# Patient Record
Sex: Female | Born: 1965 | Race: White | Hispanic: No | Marital: Married | Smoking: Never smoker
Health system: Southern US, Community
[De-identification: ages and names within clinical notes are randomized; demographics above are authoritative.]

## PROBLEM LIST (undated history)

## (undated) DIAGNOSIS — F329 Major depressive disorder, single episode, unspecified: Secondary | ICD-10-CM

## (undated) DIAGNOSIS — K5900 Constipation, unspecified: Secondary | ICD-10-CM

## (undated) DIAGNOSIS — E049 Nontoxic goiter, unspecified: Secondary | ICD-10-CM

## (undated) DIAGNOSIS — N809 Endometriosis, unspecified: Secondary | ICD-10-CM

## (undated) DIAGNOSIS — T7840XA Allergy, unspecified, initial encounter: Secondary | ICD-10-CM

## (undated) DIAGNOSIS — F32A Depression, unspecified: Secondary | ICD-10-CM

## (undated) DIAGNOSIS — G4730 Sleep apnea, unspecified: Secondary | ICD-10-CM

## (undated) HISTORY — DX: Endometriosis, unspecified: N80.9

## (undated) HISTORY — PX: BIOPSY THYROID: PRO38

## (undated) HISTORY — PX: COLONOSCOPY: SHX174

## (undated) HISTORY — DX: Allergy, unspecified, initial encounter: T78.40XA

## (undated) HISTORY — DX: Constipation, unspecified: K59.00

## (undated) HISTORY — PX: BREAST BIOPSY: SHX20

## (undated) HISTORY — DX: Sleep apnea, unspecified: G47.30

## (undated) HISTORY — PX: LEFT OOPHORECTOMY: SHX1961

## (undated) HISTORY — DX: Major depressive disorder, single episode, unspecified: F32.9

## (undated) HISTORY — DX: Depression, unspecified: F32.A

## (undated) HISTORY — DX: Nontoxic goiter, unspecified: E04.9

## (undated) HISTORY — PX: LAPAROTOMY: SHX154

---

## 1997-02-27 HISTORY — PX: CHOLECYSTECTOMY: SHX55

## 2003-01-21 ENCOUNTER — Other Ambulatory Visit: Admission: RE | Admit: 2003-01-21 | Discharge: 2003-01-21 | Payer: Self-pay | Admitting: Obstetrics and Gynecology

## 2003-04-16 ENCOUNTER — Encounter: Admission: RE | Admit: 2003-04-16 | Discharge: 2003-04-16 | Payer: Self-pay | Admitting: Obstetrics and Gynecology

## 2003-07-02 ENCOUNTER — Encounter: Admission: RE | Admit: 2003-07-02 | Discharge: 2003-09-30 | Payer: Self-pay | Admitting: Internal Medicine

## 2003-10-12 ENCOUNTER — Encounter: Admission: RE | Admit: 2003-10-12 | Discharge: 2003-10-12 | Payer: Self-pay | Admitting: Internal Medicine

## 2004-02-23 ENCOUNTER — Ambulatory Visit: Payer: Self-pay | Admitting: Family Medicine

## 2004-04-01 ENCOUNTER — Other Ambulatory Visit: Admission: RE | Admit: 2004-04-01 | Discharge: 2004-04-01 | Payer: Self-pay | Admitting: Obstetrics and Gynecology

## 2004-04-26 ENCOUNTER — Ambulatory Visit: Payer: Self-pay | Admitting: Internal Medicine

## 2004-05-02 ENCOUNTER — Ambulatory Visit: Payer: Self-pay | Admitting: Internal Medicine

## 2004-09-01 ENCOUNTER — Ambulatory Visit: Payer: Self-pay | Admitting: Internal Medicine

## 2004-09-30 ENCOUNTER — Ambulatory Visit: Payer: Self-pay | Admitting: Internal Medicine

## 2005-06-09 ENCOUNTER — Other Ambulatory Visit: Admission: RE | Admit: 2005-06-09 | Discharge: 2005-06-09 | Payer: Self-pay | Admitting: Obstetrics and Gynecology

## 2005-07-04 ENCOUNTER — Ambulatory Visit: Payer: Self-pay | Admitting: Internal Medicine

## 2005-07-28 ENCOUNTER — Ambulatory Visit: Payer: Self-pay | Admitting: Internal Medicine

## 2005-08-25 ENCOUNTER — Ambulatory Visit: Payer: Self-pay | Admitting: Internal Medicine

## 2005-09-29 ENCOUNTER — Ambulatory Visit: Payer: Self-pay | Admitting: Internal Medicine

## 2005-10-26 ENCOUNTER — Ambulatory Visit: Payer: Self-pay | Admitting: Internal Medicine

## 2005-12-08 ENCOUNTER — Ambulatory Visit: Payer: Self-pay | Admitting: Internal Medicine

## 2006-02-05 ENCOUNTER — Ambulatory Visit: Payer: Self-pay | Admitting: Internal Medicine

## 2006-02-21 ENCOUNTER — Ambulatory Visit: Payer: Self-pay | Admitting: Internal Medicine

## 2006-02-21 LAB — CONVERTED CEMR LAB
ALT: 28 units/L (ref 0–40)
Alkaline Phosphatase: 74 units/L (ref 39–117)
BUN: 13 mg/dL (ref 6–23)
Basophils Relative: 0.4 % (ref 0.0–1.0)
Calcium: 8.5 mg/dL (ref 8.4–10.5)
Cholesterol: 193 mg/dL (ref 0–200)
GFR calc non Af Amer: 65 mL/min
Glomerular Filtration Rate, Af Am: 79 mL/min/{1.73_m2}
Glucose, Bld: 90 mg/dL (ref 70–99)
LDL Cholesterol: 116 mg/dL — ABNORMAL HIGH (ref 0–99)
Lymphocytes Relative: 36.2 % (ref 12.0–46.0)
Platelets: 300 10*3/uL (ref 150–400)
Potassium: 3.6 meq/L (ref 3.5–5.1)
TSH: 4.15 microintl units/mL (ref 0.35–5.50)
Total Protein: 6.2 g/dL (ref 6.0–8.3)
WBC: 9.2 10*3/uL (ref 4.5–10.5)

## 2006-03-23 ENCOUNTER — Ambulatory Visit: Payer: Self-pay | Admitting: Internal Medicine

## 2006-07-11 ENCOUNTER — Other Ambulatory Visit: Admission: RE | Admit: 2006-07-11 | Discharge: 2006-07-11 | Payer: Self-pay | Admitting: Obstetrics and Gynecology

## 2006-07-20 ENCOUNTER — Ambulatory Visit: Payer: Self-pay | Admitting: Internal Medicine

## 2006-08-23 ENCOUNTER — Encounter: Admission: RE | Admit: 2006-08-23 | Discharge: 2006-08-23 | Payer: Self-pay | Admitting: Obstetrics and Gynecology

## 2006-09-06 ENCOUNTER — Encounter: Payer: Self-pay | Admitting: Internal Medicine

## 2006-09-06 DIAGNOSIS — N809 Endometriosis, unspecified: Secondary | ICD-10-CM | POA: Insufficient documentation

## 2006-09-06 DIAGNOSIS — J309 Allergic rhinitis, unspecified: Secondary | ICD-10-CM | POA: Insufficient documentation

## 2006-09-06 DIAGNOSIS — F329 Major depressive disorder, single episode, unspecified: Secondary | ICD-10-CM | POA: Insufficient documentation

## 2006-09-06 DIAGNOSIS — F3289 Other specified depressive episodes: Secondary | ICD-10-CM | POA: Insufficient documentation

## 2006-11-16 ENCOUNTER — Ambulatory Visit: Payer: Self-pay | Admitting: Internal Medicine

## 2006-11-16 DIAGNOSIS — L538 Other specified erythematous conditions: Secondary | ICD-10-CM | POA: Insufficient documentation

## 2006-11-16 DIAGNOSIS — R946 Abnormal results of thyroid function studies: Secondary | ICD-10-CM | POA: Insufficient documentation

## 2006-11-23 LAB — CONVERTED CEMR LAB
CO2: 29 meq/L (ref 19–32)
Cholesterol: 195 mg/dL (ref 0–200)
Creatinine, Ser: 1 mg/dL (ref 0.4–1.2)
Glucose, Bld: 129 mg/dL — ABNORMAL HIGH (ref 70–99)
HDL: 51.6 mg/dL (ref >39.0)
Potassium: 4.6 meq/L (ref 3.5–5.1)
Sodium: 143 meq/L (ref 135–145)
Triglycerides: 173 mg/dL — ABNORMAL HIGH (ref 0–149)
VLDL: 35 mg/dL (ref 0–40)

## 2006-12-20 ENCOUNTER — Ambulatory Visit: Payer: Self-pay | Admitting: Internal Medicine

## 2006-12-20 DIAGNOSIS — R599 Enlarged lymph nodes, unspecified: Secondary | ICD-10-CM | POA: Insufficient documentation

## 2006-12-20 LAB — CONVERTED CEMR LAB
CMV IgM: <0.9 (ref <0.90)
Toxoplasma Antibody- IgM: 0.38

## 2007-01-04 ENCOUNTER — Ambulatory Visit: Payer: Self-pay | Admitting: Internal Medicine

## 2007-01-04 DIAGNOSIS — R5381 Other malaise: Secondary | ICD-10-CM | POA: Insufficient documentation

## 2007-01-04 DIAGNOSIS — R5383 Other fatigue: Secondary | ICD-10-CM

## 2007-01-08 ENCOUNTER — Telehealth: Payer: Self-pay | Admitting: Internal Medicine

## 2007-01-15 ENCOUNTER — Ambulatory Visit: Payer: Self-pay | Admitting: Pulmonary Disease

## 2007-01-30 ENCOUNTER — Encounter: Payer: Self-pay | Admitting: Pulmonary Disease

## 2007-01-30 ENCOUNTER — Ambulatory Visit (HOSPITAL_BASED_OUTPATIENT_CLINIC_OR_DEPARTMENT_OTHER): Admission: RE | Admit: 2007-01-30 | Discharge: 2007-01-30 | Payer: Self-pay | Admitting: Pulmonary Disease

## 2007-02-14 ENCOUNTER — Ambulatory Visit: Payer: Self-pay | Admitting: Pulmonary Disease

## 2007-02-14 ENCOUNTER — Telehealth (INDEPENDENT_AMBULATORY_CARE_PROVIDER_SITE_OTHER): Payer: Self-pay | Admitting: *Deleted

## 2007-02-15 ENCOUNTER — Telehealth (INDEPENDENT_AMBULATORY_CARE_PROVIDER_SITE_OTHER): Payer: Self-pay | Admitting: *Deleted

## 2007-02-26 ENCOUNTER — Ambulatory Visit: Payer: Self-pay | Admitting: Pulmonary Disease

## 2007-02-26 DIAGNOSIS — G4733 Obstructive sleep apnea (adult) (pediatric): Secondary | ICD-10-CM | POA: Insufficient documentation

## 2007-03-13 ENCOUNTER — Telehealth: Payer: Self-pay | Admitting: Internal Medicine

## 2007-03-13 DIAGNOSIS — M545 Low back pain, unspecified: Secondary | ICD-10-CM | POA: Insufficient documentation

## 2007-03-29 ENCOUNTER — Ambulatory Visit: Payer: Self-pay | Admitting: Pulmonary Disease

## 2007-04-19 ENCOUNTER — Ambulatory Visit: Payer: Self-pay | Admitting: Internal Medicine

## 2007-04-19 DIAGNOSIS — E669 Obesity, unspecified: Secondary | ICD-10-CM | POA: Insufficient documentation

## 2007-04-23 ENCOUNTER — Telehealth (INDEPENDENT_AMBULATORY_CARE_PROVIDER_SITE_OTHER): Payer: Self-pay | Admitting: *Deleted

## 2007-05-10 ENCOUNTER — Encounter: Payer: Self-pay | Admitting: Internal Medicine

## 2007-05-13 ENCOUNTER — Encounter: Payer: Self-pay | Admitting: Pulmonary Disease

## 2007-05-17 ENCOUNTER — Encounter: Admission: RE | Admit: 2007-05-17 | Discharge: 2007-05-17 | Payer: Self-pay | Admitting: Internal Medicine

## 2007-09-16 ENCOUNTER — Encounter: Admission: RE | Admit: 2007-09-16 | Discharge: 2007-09-16 | Payer: Self-pay | Admitting: Obstetrics and Gynecology

## 2007-09-20 ENCOUNTER — Other Ambulatory Visit: Admission: RE | Admit: 2007-09-20 | Discharge: 2007-09-20 | Payer: Self-pay | Admitting: Obstetrics and Gynecology

## 2007-10-28 ENCOUNTER — Telehealth: Payer: Self-pay | Admitting: Internal Medicine

## 2007-11-07 ENCOUNTER — Encounter: Admission: RE | Admit: 2007-11-07 | Discharge: 2007-11-07 | Payer: Self-pay | Admitting: Internal Medicine

## 2007-11-15 ENCOUNTER — Encounter: Payer: Self-pay | Admitting: Internal Medicine

## 2007-11-18 ENCOUNTER — Encounter: Payer: Self-pay | Admitting: Internal Medicine

## 2007-12-20 ENCOUNTER — Telehealth: Payer: Self-pay | Admitting: Internal Medicine

## 2007-12-24 ENCOUNTER — Encounter: Payer: Self-pay | Admitting: Internal Medicine

## 2008-01-07 ENCOUNTER — Telehealth: Payer: Self-pay | Admitting: Internal Medicine

## 2008-01-24 ENCOUNTER — Ambulatory Visit: Payer: Self-pay | Admitting: Internal Medicine

## 2008-01-24 DIAGNOSIS — M25519 Pain in unspecified shoulder: Secondary | ICD-10-CM | POA: Insufficient documentation

## 2008-01-28 ENCOUNTER — Encounter: Payer: Self-pay | Admitting: Internal Medicine

## 2008-01-30 ENCOUNTER — Telehealth: Payer: Self-pay | Admitting: Internal Medicine

## 2008-02-17 ENCOUNTER — Encounter: Payer: Self-pay | Admitting: Internal Medicine

## 2008-07-24 ENCOUNTER — Ambulatory Visit: Payer: Self-pay | Admitting: Internal Medicine

## 2008-07-24 ENCOUNTER — Encounter: Admission: RE | Admit: 2008-07-24 | Discharge: 2008-07-24 | Payer: Self-pay | Admitting: Endocrinology

## 2008-07-24 DIAGNOSIS — K6289 Other specified diseases of anus and rectum: Secondary | ICD-10-CM | POA: Insufficient documentation

## 2008-07-24 DIAGNOSIS — L290 Pruritus ani: Secondary | ICD-10-CM | POA: Insufficient documentation

## 2008-08-11 ENCOUNTER — Encounter: Payer: Self-pay | Admitting: Internal Medicine

## 2008-08-11 ENCOUNTER — Other Ambulatory Visit: Admission: RE | Admit: 2008-08-11 | Discharge: 2008-08-11 | Payer: Self-pay | Admitting: Diagnostic Radiology

## 2008-08-11 ENCOUNTER — Encounter: Admission: RE | Admit: 2008-08-11 | Discharge: 2008-08-11 | Payer: Self-pay | Admitting: Endocrinology

## 2008-08-11 ENCOUNTER — Encounter (INDEPENDENT_AMBULATORY_CARE_PROVIDER_SITE_OTHER): Payer: Self-pay | Admitting: Diagnostic Radiology

## 2008-09-02 ENCOUNTER — Telehealth (INDEPENDENT_AMBULATORY_CARE_PROVIDER_SITE_OTHER): Payer: Self-pay | Admitting: *Deleted

## 2008-10-09 ENCOUNTER — Other Ambulatory Visit: Admission: RE | Admit: 2008-10-09 | Discharge: 2008-10-09 | Payer: Self-pay | Admitting: Obstetrics and Gynecology

## 2008-12-31 ENCOUNTER — Ambulatory Visit: Payer: Self-pay | Admitting: Internal Medicine

## 2008-12-31 DIAGNOSIS — R197 Diarrhea, unspecified: Secondary | ICD-10-CM | POA: Insufficient documentation

## 2009-02-02 ENCOUNTER — Encounter: Payer: Self-pay | Admitting: Internal Medicine

## 2009-02-09 ENCOUNTER — Telehealth: Payer: Self-pay | Admitting: Internal Medicine

## 2009-02-15 ENCOUNTER — Encounter: Admission: RE | Admit: 2009-02-15 | Discharge: 2009-02-15 | Payer: Self-pay | Admitting: Endocrinology

## 2009-02-17 ENCOUNTER — Ambulatory Visit: Payer: Self-pay | Admitting: Internal Medicine

## 2009-02-17 LAB — CONVERTED CEMR LAB
BUN: 14 mg/dL (ref 6–23)
CO2: 26 meq/L (ref 19–32)
Calcium: 8.6 mg/dL (ref 8.4–10.5)
Cholesterol: 203 mg/dL — ABNORMAL HIGH (ref 0–200)
GFR calc non Af Amer: 64.3 mL/min (ref >60)
Glucose, Bld: 98 mg/dL (ref 70–99)
HDL: 57 mg/dL (ref >39.00)
Triglycerides: 105 mg/dL (ref 0.0–149.0)

## 2009-03-01 ENCOUNTER — Encounter: Admission: RE | Admit: 2009-03-01 | Discharge: 2009-03-01 | Payer: Self-pay | Admitting: Obstetrics and Gynecology

## 2009-03-04 ENCOUNTER — Telehealth: Payer: Self-pay | Admitting: *Deleted

## 2009-06-25 ENCOUNTER — Encounter: Payer: Self-pay | Admitting: Internal Medicine

## 2009-07-15 ENCOUNTER — Telehealth: Payer: Self-pay | Admitting: Internal Medicine

## 2009-07-16 ENCOUNTER — Ambulatory Visit: Payer: Self-pay | Admitting: Internal Medicine

## 2009-07-16 LAB — CONVERTED CEMR LAB: Vit D, 25-Hydroxy: 45 ng/mL (ref 30–89)

## 2009-07-28 ENCOUNTER — Ambulatory Visit: Payer: Self-pay | Admitting: Internal Medicine

## 2009-07-28 DIAGNOSIS — D509 Iron deficiency anemia, unspecified: Secondary | ICD-10-CM | POA: Insufficient documentation

## 2009-07-28 DIAGNOSIS — G479 Sleep disorder, unspecified: Secondary | ICD-10-CM | POA: Insufficient documentation

## 2009-10-15 ENCOUNTER — Ambulatory Visit: Payer: Self-pay | Admitting: Internal Medicine

## 2009-10-15 LAB — CONVERTED CEMR LAB
Basophils Absolute: 0.1 10*3/uL (ref 0.0–0.1)
Eosinophils Relative: 1.4 % (ref 0.0–5.0)
HCT: 37.1 % (ref 36.0–46.0)
Hemoglobin: 12.7 g/dL (ref 12.0–15.0)
Lymphocytes Relative: 37.4 % (ref 12.0–46.0)
Lymphs Abs: 3.1 10*3/uL (ref 0.7–4.0)
Monocytes Relative: 4.6 % (ref 3.0–12.0)
Platelets: 291 10*3/uL (ref 150.0–400.0)
Saturation Ratios: 17.3 % — ABNORMAL LOW (ref 20.0–50.0)
Transferrin: 280.7 mg/dL (ref 212.0–360.0)
WBC: 8.4 10*3/uL (ref 4.5–10.5)

## 2009-10-22 ENCOUNTER — Ambulatory Visit: Payer: Self-pay | Admitting: Internal Medicine

## 2009-10-22 DIAGNOSIS — G5600 Carpal tunnel syndrome, unspecified upper limb: Secondary | ICD-10-CM | POA: Insufficient documentation

## 2009-12-17 ENCOUNTER — Encounter: Payer: Self-pay | Admitting: Internal Medicine

## 2010-01-24 ENCOUNTER — Ambulatory Visit: Payer: Self-pay | Admitting: Internal Medicine

## 2010-01-24 LAB — CONVERTED CEMR LAB
Ferritin: 40.7 ng/mL (ref 10.0–291.0)
Iron: 74 ug/dL (ref 42–145)
Saturation Ratios: 15.2 % — ABNORMAL LOW (ref 20.0–50.0)

## 2010-01-28 ENCOUNTER — Ambulatory Visit: Payer: Self-pay | Admitting: Internal Medicine

## 2010-01-28 DIAGNOSIS — L988 Other specified disorders of the skin and subcutaneous tissue: Secondary | ICD-10-CM | POA: Insufficient documentation

## 2010-03-20 ENCOUNTER — Encounter: Payer: Self-pay | Admitting: Orthopedic Surgery

## 2010-03-20 ENCOUNTER — Encounter: Payer: Self-pay | Admitting: Endocrinology

## 2010-03-29 NOTE — Assessment & Plan Note (Signed)
Summary: fup labs//ccm   Vital Signs:  Patient profile:   45 year old female Menstrual status:  regular LMP:     07/26/2009 Height:      68 inches Weight:      286 pounds BMI:     43.64 Pulse rate:   66 / minute BP sitting:   110 / 70  (left arm) Cuff size:   large  Vitals Entered By: Romualdo Bolk, CMA (AAMA) (July 28, 2009 3:15 PM) CC: Follow-up visit on labs LMP (date): 07/26/2009 LMP - Character: normal Menarche (age onset years): unsure   Menses interval (days): 28 Menstrual flow (days): 4-5 Enter LMP: 07/26/2009   History of Present Illness: Maria Terrell comes in today   follow up labs   On loovral  and  pristique  doing ok.  NO heavy bleeding.  Also taking Vitamins  otc  ocall zyrtec.   Sees Dr Nolen Mu    and on meds to try for alertness .had annothe sleep test.     and told she has mild sleep apnea.   and unusual leg movements    at times.  Dr Nolen Mu ordered some labs that showed low iron .  has been sleeping in recliiner   for the past year  because of back pain.    pinched nerve in back.    Does have problem getting comfortable but no shaking of legs ...  Preventive Screening-Counseling & Management  Alcohol-Tobacco     Alcohol drinks/day: 0     Smoking Status: never  Caffeine-Diet-Exercise     Caffeine use/day: 4     Does Patient Exercise: no  Current Medications (verified): 1)  Zyrtec Allergy 10 Mg Tabs (Cetirizine Hcl) .... Once Daily As Needed 2)  Valtrex 500 Mg  Tabs (Valacyclovir Hcl) .... As Needed 3)  Multivitamins   Tabs (Multiple Vitamin) .... Once Daily 4)  Bl Echinacea 167 Mg  Tabs (Echinacea) .... Three Tabs Three Times A Day As Needed 5)  Flexeril 10 Mg  Tabs (Cyclobenzaprine Hcl) .Marland Kitchen.. 1 By Mouth Three Times A Day As Needed 6)  Meloxicam 15 Mg Tabs (Meloxicam) .... Take One Tab Once Daily 7)  Pristiq 50 Mg Xr24h-Tab (Desvenlafaxine Succinate) .Marland Kitchen.. 1 By Mouth Once Daily 8)  Anusol-Hc 25 Mg Supp (Hydrocortisone Acetate) .... One Pr Two  Times A Day For Up To 2 Weeks 9)  Vitamin D (Ergocalciferol) 50000 Unit Caps (Ergocalciferol) .Marland Kitchen.. 1 By Mouth Weekly  Allergies (verified): No Known Drug Allergies  Past History:  Past medical, surgical, family and social histories (including risk factors) reviewed, and no changes noted (except as noted below).  Past Medical History: Allergic rhinitis Depression Endometriosis goiter  thyroid nodule and needle bx  Low back pain Mild sleep apnea    Past Surgical History: Reviewed history from 09/06/2006 and no changes required. Laparotomy-exploratory for left partial oophorectomy and endometrium  Past History:  Care Management: Endocrinology: Horald Pollen Orthopedics: Dewaine Conger Neurology: Christena Flake Gynecology: Tinnie Gens Psychiatry: Nolen Mu  Family History: Reviewed history from 04/19/2007 and no changes required. Family History of CAD Female 1st degree relative..  father has stent Family History High cholesterol Family History Thyroid disease Father OSA Bro BIPOLAR  Social History: Reviewed history from 07/24/2008 and no changes required. 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  Review of Systems  The patient denies anorexia, fever, weight loss, vision loss, decreased hearing, prolonged cough, melena, hematochezia, severe indigestion/heartburn, hematuria, transient blindness, abnormal bleeding, enlarged  lymph nodes, angioedema, and breast masses.    Physical Exam  General:  alert, well-developed, well-nourished, and well-hydrated.   Head:  normocephalic and atraumatic.   Neck:  palpable thyroid no masses otherwise supple.   Msk:  no atrophy nl gait  tender lower ls area   Pulses:  pulses intact without delay  nl cap refill  Extremities:  no clubbing cyanosis or edema  Neurologic:  alert & oriented X3, strength normal in all extremities, and gait normal.   Skin:  turgor normal, color normal, no ecchymoses, and no petechiae.     Cervical Nodes:  No lymphadenopathy noted Psych:  Oriented X3, normally interactive, good eye contact, and not anxious appearing.  slightly subdued cognition nl  labs  obtained by calling Dr Walker Shadow office and  reviewed while patient was here. 25 minutes   Impression & Recommendations:  Problem # 1:  IRON DEFICIENCY (ICD-280.9) low iron saturation   no excess bleeding    could contribute to nocturnal leg movements  so will rx and then follow up counseled  Her updated medication list for this problem includes:    Ferrex 150 150 Mg Caps (Polysaccharide iron complex) .Marland Kitchen... 1 by mouth once daily  ferritin 37   iron saturation  11.2 % ( 20-55)  Problem # 2:  SLEEP DISORDER (ICD-780.50) combo sleep apnea , pain and other  Problem # 3:  LOW BACK PAIN, CHRONIC (ICD-724.2) Assessment: Unchanged  Her updated medication list for this problem includes:    Flexeril 10 Mg Tabs (Cyclobenzaprine hcl) .Marland Kitchen... 1 by mouth three times a day as needed    Meloxicam 15 Mg Tabs (Meloxicam) .Marland Kitchen... Take one tab once daily  Problem # 4:  OBSTRUCTIVE SLEEP APNEA (ICD-327.23) Assessment: Comment Only mild   Problem # 5:  DEPRESSION (ICD-311)  Her updated medication list for this problem includes:    Pristiq 50 Mg Xr24h-tab (Desvenlafaxine succinate) .Marland Kitchen... 1 by mouth once daily  Problem # 6:  OBESITY (ICD-278.00) Assessment: Comment Only  Complete Medication List: 1)  Zyrtec Allergy 10 Mg Tabs (Cetirizine hcl) .... Once daily as needed 2)  Valtrex 500 Mg Tabs (Valacyclovir hcl) .... As needed 3)  Multivitamins Tabs (Multiple vitamin) .... Once daily 4)  Bl Echinacea 167 Mg Tabs (Echinacea) .... Three tabs three times a day as needed 5)  Flexeril 10 Mg Tabs (Cyclobenzaprine hcl) .Marland Kitchen.. 1 by mouth three times a day as needed 6)  Meloxicam 15 Mg Tabs (Meloxicam) .... Take one tab once daily 7)  Pristiq 50 Mg Xr24h-tab (Desvenlafaxine succinate) .Marland Kitchen.. 1 by mouth once daily 8)  Anusol-hc 25 Mg Supp  (Hydrocortisone acetate) .... One pr two times a day for up to 2 weeks 9)  Vitamin D (ergocalciferol) 50000 Unit Caps (Ergocalciferol) .Marland Kitchen.. 1 by mouth weekly 10)  Nuvigil 250 Mg Tabs (Armodafinil) .... 1/2 by mouth as needed  per dr Nolen Mu 11)  Ferrex 150 150 Mg Caps (Polysaccharide iron complex) .Marland Kitchen.. 1 by mouth once daily  Patient Instructions: 1)  take iron  1 each day  with 250 mg vitamin c to help absorption . 2)  in 2-3 months  check IBC panel and ferritin and cbcdiff ( dx iron  saturation) 3)  then OV . 4)  this may helps your legs at night  if iron  stores improve.  5)  Call in the meantime  if ther is a problem with this. Prescriptions: FERREX 150 150 MG CAPS (POLYSACCHARIDE IRON COMPLEX) 1 by mouth once daily  #  30 x 3   Entered and Authorized by:   Madelin Headings MD   Signed by:   Madelin Headings MD on 07/28/2009   Method used:   Print then Give to Patient   RxID:   [id number]

## 2010-03-29 NOTE — Progress Notes (Signed)
Summary: Rx for 50,000 IU   Phone Note Call from Patient Call back at Home Phone [phone]   Caller: Patient Summary of Call: Pt called saying that she spoke with Durwin Nora her nutrionisist and she told her that she needs a rx for 50,000 International Units of vit d called in. [place]ace [place] told her to take this for 12 weeks then to have her levels checked again and if normal she could go to 2,000 international units of vit d once a day. But that we would need to call this in. Pt is wanting to know if we can call in rx. Initial call taken by: Romualdo Bolk, CMA (AAMA),  March 04, 2009 11:07 AM  Follow-up for Phone Call        no standard for this treatment but  can do the weekly 50000 international units one  q week for  12 weeks   disp 12  refill x 1 can recheck level and .ROV     here to review Follow-up by: Madelin Headings MD,  March 04, 2009 11:45 AM  Additional Follow-up for Phone Call Additional follow up Details #1::        Left message on machine that rx was sent to [facility]. Also to schedule a vit d level and rov. Additional Follow-up by: Romualdo Bolk, CMA (AAMA),  March 04, 2009 11:50 AM    New/Updated Medications: VITAMIN D (ERGOCALCIFEROL) 50000 UNIT CAPS (ERGOCALCIFEROL) 1 by mouth weekly Prescriptions: VITAMIN D (ERGOCALCIFEROL) 50000 UNIT CAPS (ERGOCALCIFEROL) 1 by mouth weekly  #12 x 1   Entered by:   Romualdo Bolk, CMA (AAMA)   Authorized by:   Madelin Headings MD   Signed by:   Romualdo Bolk, CMA (AAMA) on 03/04/2009   Method used:   [facility] to        [facility]  [phone]* (retail)       [address] [place], [place]  [address]       Ph: [phone] or [phone]       Fax: [phone]   RxID:   [id number]

## 2010-03-29 NOTE — Progress Notes (Signed)
Summary: add B12 per Nutrionist?  Phone Note Call from Patient   Caller: Patient Call For: Madelin Headings MD Summary of Call: Pt wants to schedule her Vitamin D lab appt, and add B12 to the order? [phone] May leave message. Initial call taken by: Lynann Beaver CMA,  Jul 15, 2009 9:49 AM  Follow-up for Phone Call        pt called again. is it ok to add b12 to her lab appt. her vit d lab is scheduled for tomorrow pm. Follow-up by: Warnell Forester,  Jul 15, 2009 10:06 AM  Additional Follow-up for Phone Call Additional follow up Details #1::        Per Dr. Fabian Sharp- okay to do.  Additional Follow-up by: Romualdo Bolk, CMA Duncan Dull),  Jul 15, 2009 11:34 AM    Additional Follow-up for Phone Call Additional follow up Details #2::    pt is aware. Follow-up by: Warnell Forester,  Jul 15, 2009 12:19 PM

## 2010-03-29 NOTE — Letter (Signed)
Summary: [facility]   Imported By: Maryln Gottron 12/28/2009 09:57:21  _____________________________________________________________________  External Attachment:    Type:   Image     Comment:   External Document

## 2010-03-29 NOTE — Assessment & Plan Note (Signed)
Summary: 3 month rov/njr [facility] BMP/NJR   Vital Signs:  Patient profile:   45 year old female Menstrual status:  regular Weight:      295 pounds Temp:     99.6 degrees F oral BP sitting:   122 / 82  (left arm) Cuff size:   regular  Vitals Entered By: Kern Reap CMA Duncan Dull) (January 28, 2010 2:23 PM) CC: follow-up visit   History of Present Illness: Maria Terrell comes in today  for follow up of iron deficiency  and fatigue  . also has a sore bump on right  buttocks are  just popped up .  no fever or trauma.   OSA  just started using  rx regularly recently nasal pillows  but needs a mask.   not seeing Dr Shelle Iron .  ? Dr Richardean Chimera and or Dr Nolen Mu .      feels better energy.  Unsure who is managing th esleep apnea.    Preventive Screening-Counseling & Management  Alcohol-Tobacco     Alcohol drinks/day: 0     Smoking Status: never  Allergies: No Known Drug Allergies  Past History:  Past medical, surgical, family and social histories (including risk factors) reviewed for relevance to current acute and chronic problems.  Past Medical History: Reviewed history from 07/28/2009 and no changes required. Allergic rhinitis Depression Endometriosis goiter  thyroid nodule and needle bx  Low back pain Mild sleep apnea    Past Surgical History: Reviewed history from 09/06/2006 and no changes required. Laparotomy-exploratory for left partial oophorectomy and endometrium  Past History:  Care Management: Endocrinology: Horald Pollen Orthopedics: Delbert Harness  Neurology: Claudina Lick ,Dohmeir? Gynecology: Tinnie Gens Psychiatry: Nolen Mu  Dr Shelle Iron in the past  Family History: Reviewed history from 04/19/2007 and no changes required. Family History of CAD Female 1st degree relative..  father has stent Family History High cholesterol Family History Thyroid disease Father OSA Bro BIPOLAR  Social History: Reviewed history from 10/22/2009 and no changes  required. Occupation:ultrasonographer right handed   Never Smoked  hh of 1 cutting back on caffeine.  Review of Systems       no change  fever  weigh t loss  bleeding    Physical Exam  General:  Well-developed,well-nourished,in no acute distress; alert,appropriate and cooperative throughout examination Lungs:  normal respiratory effort and no intercostal retractions.   Genitalia:  normal introitus.  small 3 mm purplish bum with center slightly tender  right perineum    no drainage and no abscess or fluctuance  Neurologic:  grossly normal Skin:  no ecchymoses and no petechiae.  see gu exam Cervical Nodes:  No lymphadenopathy noted Inguinal Nodes:  No significant adenopathy Psych:  Oriented X3, normally interactive, good eye contact, not anxious appearing, and not depressed appearing.     Impression & Recommendations:  Problem # 1:  IRON DEFICIENCY (ICD-280.9) Assessment Unchanged about the same but   did nt take two times a day for the last month and got off schedule  Her updated medication list for this problem includes:    Ferrex 150 150 Mg Caps (Polysaccharide iron complex) .Marland Kitchen... 1 by mouth two times a day  Problem # 2:  OBSTRUCTIVE SLEEP APNEA (ICD-327.23) ? followed    sent via neur and psych   needs ? to try mask    rec she get whoever managing her OSa to do equipment refill    to ensure appropariate .lfu.   but call if needed    if  we need to  help.   Problem # 3:  OTHER SPECIFIED DISORDER OF SKIN (ICD-709.8) Assessment: New warm  compresses to   perineal area.    antibiotic if needed topical  to area  if persistent or  progressive  .  Complete Medication List: 1)  Zyrtec Allergy 10 Mg Tabs (Cetirizine hcl) .... Once daily as needed 2)  Valtrex 500 Mg Tabs (Valacyclovir hcl) .... As needed 3)  Multivitamins Tabs (Multiple vitamin) .... Once daily 4)  Bl Echinacea 167 Mg Tabs (Echinacea) .... Three tabs three times a day as needed 5)  Flexeril 10 Mg Tabs  (Cyclobenzaprine hcl) .Marland Kitchen.. 1 by mouth three times a day as needed 6)  Meloxicam 15 Mg Tabs (Meloxicam) .... Take one tab once daily 7)  Pristiq 50 Mg Xr24h-tab (Desvenlafaxine succinate) .Marland Kitchen.. 1 by mouth once daily 8)  Vitamin D 1000 Unit Tabs (Cholecalciferol) 9)  Nuvigil 250 Mg Tabs (Armodafinil) .... 1/2 by mouth as needed  per dr Nolen Mu 10)  Ferrex 150 150 Mg Caps (Polysaccharide iron complex) .Marland Kitchen.. 1 by mouth two times a day  Patient Instructions: 1)  take iron two times a day  2)  after 2 months call for  repeat labs.   3)  ov if needed.   or as approrpiates.    Orders Added: 1)  Est. Patient Level III [55732]

## 2010-03-29 NOTE — Op Note (Signed)
Summary: Ultrasound Guided Needle Aspirate Biopsy Lt. Lobe Thyroid  Ultrasound Guided Needle Aspirate Biopsy Lt. Lobe Thyroid   Imported By: Maryln Gottron 03/02/2009 14:59:49  _____________________________________________________________________  External Attachment:    Type:   Image     Comment:   External Document

## 2010-03-29 NOTE — Assessment & Plan Note (Signed)
Summary: ROA/FUP/RCD   Vital Signs:  Patient profile:   45 year old female Menstrual status:  regular LMP:     10/07/2009 Weight:      288 pounds Pulse rate:   72 / minute BP sitting:   120 / 80  (left arm) Cuff size:   large  Vitals Entered By: Romualdo Bolk, CMA (AAMA) (October 22, 2009 2:25 PM) CC: Follow-up visit on labs LMP (date): 10/07/2009 LMP - Character: normal Menarche (age onset years): unsure   Menses interval (days): 28 Menstrual flow (days): 4-5 Enter LMP: 10/07/2009   History of Present Illness: Maria Terrell comes in today  for follow up of labs sleep issues and iron defciency. 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   no numbness  using splint to help hand wrist thumb.     had ncs in past and showed mild cts .   IRON defic: ne extra bleeding or donations  nl menses. taking iron once a day  no se currently. taking with vitc. No major change in sleep at this point.   Mood: stable on meds.  Preventive Screening-Counseling & Management  Alcohol-Tobacco     Alcohol drinks/day: 0     Smoking Status: never  Caffeine-Diet-Exercise     Caffeine use/day: 4     Does Patient Exercise: no  Current Medications (verified): 1)  Zyrtec Allergy 10 Mg Tabs (Cetirizine Hcl) .... Once Daily As Needed 2)  Valtrex 500 Mg  Tabs (Valacyclovir Hcl) .... As Needed 3)  Multivitamins   Tabs (Multiple Vitamin) .... Once Daily 4)  Bl Echinacea 167 Mg  Tabs (Echinacea) .... Three Tabs Three Times A Day As Needed 5)  Flexeril 10 Mg  Tabs (Cyclobenzaprine Hcl) .Marland Kitchen.. 1 By Mouth Three Times A Day As Needed 6)  Meloxicam 15 Mg Tabs (Meloxicam) .... Take One Tab Once Daily 7)  Pristiq 50 Mg Xr24h-Tab (Desvenlafaxine Succinate) .Marland Kitchen.. 1 By Mouth Once Daily 8)  Vitamin D 1000 Unit Tabs (Cholecalciferol) 9)  Nuvigil 250 Mg  Tabs (Armodafinil) .... 1/2 By Mouth As Needed  Per Dr Nolen Mu 10)  Ferrex 150 150 Mg Caps (Polysaccharide Iron Complex) .Marland Kitchen.. 1 By Mouth Once Daily  Allergies (verified): No Known Drug Allergies  Past History:  Past medical, surgical, family and social histories (including risk factors) reviewed, and no changes noted (except as noted below).  Past Medical History: Reviewed history from 07/28/2009 and no changes required. Allergic rhinitis Depression Endometriosis goiter  thyroid nodule and needle bx  Low back pain Mild sleep apnea    Past Surgical History: Reviewed history from 09/06/2006 and no changes required. Laparotomy-exploratory for left partial oophorectomy and endometrium  Past History:  Care Management: Endocrinology: Horald Pollen Orthopedics: Delbert Harness  Neurology: Christena Flake  elsner  Gynecology: Tinnie Gens Psychiatry: Nolen Mu  Family History: Reviewed history from 04/19/2007 and no changes required. Family History of CAD Female 1st degree relative..  father has stent Family History High cholesterol Family History Thyroid disease Father OSA Bro BIPOLAR  Social History: Reviewed history from 07/24/2008 and no changes required. Occupation:ultrasonographer right handed   Never Smoked  hh of 1 cutting back on caffeine.  Review of Systems  The patient denies anorexia, fever, weight loss, weight gain, vision loss, syncope, dyspnea on exertion, prolonged cough, abdominal pain, melena, hematochezia, hematuria, muscle  weakness, abnormal bleeding, enlarged lymph nodes, and angioedema.         has low back pain issue followed by dr elsner . stable  sees dr Nolen Mu    Physical Exam  General:  Well-developed,well-nourished,in no acute distress; alert,appropriate and cooperative throughout examination Head:  normocephalic and atraumatic.   Eyes:  vision grossly intact, pupils equal, and pupils round.   Neck:  No deformities, masses, or tenderness noted. Lungs:   normal respiratory effort and no intercostal retractions.   Msk:  right arm no atrophy  no edema  tenderness  lateral elbow and above olecrenon and  near thumb extensor tendons.    grip ok    pain with rotation  Pulses:  pulses intact without delay  up nl cap refill  Skin:  turgor normal, color normal, no ecchymoses, and no petechiae.   Psych:  Oriented X3, good eye contact, and not anxious appearing.  mildly subdued but appropriate    Impression & Recommendations:  Problem # 1:  IRON DEFICIENCY (ICD-280.9) improved  but not   normal yet and no excessive bleeding   poss can tolerate  increase in dose  for awhile to see if helps sleep. as before  Her updated medication list for this problem includes:    Ferrex 150 150 Mg Caps (Polysaccharide iron complex) .Marland Kitchen... 1 by mouth two times a day  Problem # 2:  SLEEP DISORDER (ICD-780.50) see above.    Problem # 3:  CARPAL TUNNEL SYNDROME, RIGHT (ICD-354.0) hx of mildly abnormal  ncs but not in ehr.       no weakness  today   Problem # 4:  ? 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      ice hs and  strapping  some   Complete Medication List: 1)  Zyrtec Allergy 10 Mg Tabs (Cetirizine hcl) .... Once daily as needed 2)  Valtrex 500 Mg Tabs (Valacyclovir hcl) .... As needed 3)  Multivitamins Tabs (Multiple vitamin) .... Once daily 4)  Bl Echinacea 167 Mg Tabs (Echinacea) .... Three tabs three times a day as needed 5)  Flexeril 10 Mg Tabs (Cyclobenzaprine hcl) .Marland Kitchen.. 1 by mouth three times a day as needed 6)  Meloxicam 15 Mg Tabs (Meloxicam) .... Take one tab once daily 7)  Pristiq 50 Mg Xr24h-tab (Desvenlafaxine succinate) .Marland Kitchen.. 1 by mouth once daily 8)  Vitamin D 1000 Unit Tabs (Cholecalciferol) 9)  Nuvigil 250 Mg Tabs (Armodafinil) .... 1/2 by mouth as needed  per dr Nolen Mu 10)  Ferrex 150 150 Mg Caps (Polysaccharide iron complex) .Marland Kitchen.. 1 by  mouth two times a day  Patient Instructions: 1)  increase  iron to two times a day as tolerated  as we discussed  2)  repeat IBC  pain and ferritin  in  3 months .  and then rov.   3)  consider seeing der gramig.     [facility]   4)  consider icing nightly   .   strap on arm as discussed ,   Prescriptions: FERREX 150 150 MG CAPS (POLYSACCHARIDE IRON COMPLEX) 1 by mouth two times a day  #60 x 3   Entered and Authorized by:   Madelin Headings MD   Signed by:   Madelin Headings MD on 10/22/2009   Method used:   [facility] to  [facility]  [phone]* (retail)       [address] [place], [place]  [address]       Ph: [phone] or [phone]       Fax: [phone]   RxID:   [id number]

## 2010-05-25 ENCOUNTER — Encounter: Payer: Self-pay | Admitting: Internal Medicine

## 2010-07-11 ENCOUNTER — Other Ambulatory Visit: Payer: Self-pay | Admitting: Internal Medicine

## 2010-07-11 DIAGNOSIS — Z1231 Encounter for screening mammogram for malignant neoplasm of breast: Secondary | ICD-10-CM

## 2010-07-12 NOTE — Assessment & Plan Note (Signed)
HEALTHCARE                             PULMONARY OFFICE NOTE   NAZIFA, TRINKA                          MRN:          [id number]  DATE:01/15/2007                            DOB:          [birth year]    HISTORY OF PRESENT ILLNESS:  The patient is a 45 year old female who I  have been asked to see for possible obstructive sleep apnea.  The  patient has been feeling very tired, and has nonrestorative sleep.  She  sleeps alone and has been told from traveling that she snores, but no  one has ever mentioned pauses in her breathing during sleep.  She denies  choking arousals.  She typically gets to bed between 10 and 12 at night  and gets up between 7 and 7:30 to start her day.  She does not feel  rested upon arising, and it will often be very difficult for her to get  up.  She will often set an alarm in her kitchen on the oven timer so  that she has to get out of bed to turn it off.  The patient works as an  Youth worker, and has noted some decreased memory and  concentration as well as work efficiency during the day.  She states  that she is too busy to have any significant sleep pressure during the  day.  However, once she gets home, if she sits down, she will doze very  easily with TV or movies.  She does have some sleep pressure with  driving.  The patient states that Dr. Fabian Sharp has checked her thyroid  function tests and they have been borderline in the past.  The  patient's weight has been neutral over the last 2 years by her history.   PAST MEDICAL HISTORY:  1. Allergic rhinitis.  2. Status post cholecystectomy.  3. Ongoing mononucleosis that was recently diagnosed.   CURRENT MEDICATIONS:  1. Wellbutrin XL 300 daily.  2. Multivitamin daily.  3. Echinacea of unknown dose daily.  4. PRN medications.   The patient has questionable allergy to CECLOR.   SOCIAL HISTORY:  She is single and does not have children.  She has  never  smoked.   FAMILY HISTORY:  Remarkable for her father having vascular disease as  well as prostate cancer.  Otherwise, noncontributory.   REVIEW OF SYSTEMS:  As per history of present illness, also see patient  intake form documented on the chart.   PHYSICAL EXAMINATION:  IN GENERAL:  She is a morbidly obese female in no  acute distress.  Blood pressure is 114/82, pulse 80, temperature 98.1.  Weight is 278  pounds, she is 5 feet 9 inches tall.  O2 saturation on room air is 97%.  HEENT:  Pupils are equal, round, and reactive to light and  accommodation, extraocular muscles are intact.  Nares are patent without  discharge.  Oropharynx shows mild elongation of the soft palate and  uvula.  NECK:  Supple without JVD or lymphadenopathy, no palpable thyromegaly.  CHEST:  Totally clear.  CARDIAC EXAM:  Reveals regular  rate and rhythm with no murmurs, rubs, or  gallops.  ABDOMEN:  Soft, nontender, nondistended with good bowel sounds.  GENITAL/RECTAL/BREAST EXAM:  Not done and not indicated.  LOWER EXTREMITIES:  Without edema.  NEUROLOGICALLY:  She is alert and oriented and has no obvious observable  motor defects.   IMPRESSION:  Probable obstructive sleep apnea.  The patient is obese,  has non-restorative sleep, and has symptoms during the day that are  suggestive of chronic sleep deprivation.  Certainly this could all be  explained by an element of depression; however, the patient is  definitely at significant risk for sleep disordered breathing, and I  feel the history is suspicious enough that it should be screened for.   PLAN:  1. Schedule for nocturnal polysomnography.  2. Work on weight loss.  3. The patient will follow up after the above.     Barbaraann Share, MD,FCCP  Electronically Signed    [facility]  DD: 01/15/2007  DT: 01/15/2007  Job #: [id number]   cc:   Neta Mends. Fabian Sharp, MD

## 2010-07-12 NOTE — Procedures (Signed)
Maria Terrell, Maria Terrell                 ACCOUNT NO.:  [id number]   MEDICAL RECORD NO.:  [id number]          PATIENT TYPE:  OUT   LOCATION:  SLEEP CENTER                 FACILITY:  [facility]   PHYSICIAN:  Barbaraann Share, MD,FCCPDATE OF BIRTH:  08-04-65   DATE OF STUDY:  01/30/2007                            NOCTURNAL POLYSOMNOGRAM   REFERRING PHYSICIAN:   INDICATION FOR STUDY:  Hypersomnia with sleep apnea.   EPWORTH SLEEPINESS SCORE:  15   MEDICATIONS:   SLEEP ARCHITECTURE:  The patient had a total sleep time of 229 minutes  with no slow wave sleep and significantly decreased REM at 58 minutes.  Sleep onset latency was prolonged at 44 minutes, and REM onset was very  prolonged at 237 minutes.  Sleep efficiency was very poor at 54%.   RESPIRATORY DATA:  The patient was found to have 2 obstructive apneas  and 25 obstructive hypopneas for an apnea/hypopnea index of 7 events per  hour.  She was also noted to have 17 respiratory effort related arousals  which gave her a respiratory disturbance index of 12 events per hour.  The events occurred primarily during REM, and there was moderate snoring  noted throughout.   OXYGEN DATA:  There was O2 desaturation as low as 85% with the patient's  obstructive events.   CARDIAC DATA:  No clinically significant arrhythmias were noted.   MOVEMENT-PARASOMNIA:  The patient was found to have no significant leg  jerks or abnormal behaviors during the study.   IMPRESSIONS-RECOMMENDATIONS:  Mild obstructive sleep apnea/hypopnea  syndrome with an apnea/hypopnea index of 7 events per hour and a  respiratory disturbance index including respiratory effort related  arousals of 12 per hour.  There was oxygen desaturation as low as 85%.  Treatment for this degree of sleep apnea  can include weight loss alone if applicable, upper airway surgery, oral  appliance, and also continuous positive airway pressure.  Clinical  correlation is suggested.      Barbaraann Share, MD,FCCP  Diplomate, American Board of Sleep  Medicine  Electronically Signed     [facility]  D:  02/14/2007 08:30:23  T:  02/14/2007 14:43:44  Job:  [id number]

## 2010-07-18 ENCOUNTER — Ambulatory Visit
Admission: RE | Admit: 2010-07-18 | Discharge: 2010-07-18 | Disposition: A | Discharge status: Home / Self Care | Payer: 59 | Source: Ambulatory Visit | Attending: Internal Medicine | Admitting: Internal Medicine

## 2010-07-18 DIAGNOSIS — Z1231 Encounter for screening mammogram for malignant neoplasm of breast: Secondary | ICD-10-CM

## 2010-08-09 ENCOUNTER — Other Ambulatory Visit: Payer: Self-pay | Admitting: Endocrinology

## 2010-08-09 DIAGNOSIS — E049 Nontoxic goiter, unspecified: Secondary | ICD-10-CM

## 2010-09-12 ENCOUNTER — Other Ambulatory Visit: Payer: 59

## 2011-04-20 ENCOUNTER — Other Ambulatory Visit (HOSPITAL_COMMUNITY): Payer: Self-pay | Admitting: Endocrinology

## 2011-04-20 DIAGNOSIS — E049 Nontoxic goiter, unspecified: Secondary | ICD-10-CM

## 2011-04-20 DIAGNOSIS — E041 Nontoxic single thyroid nodule: Secondary | ICD-10-CM

## 2011-04-21 ENCOUNTER — Other Ambulatory Visit (HOSPITAL_COMMUNITY): Payer: 59

## 2011-04-21 ENCOUNTER — Other Ambulatory Visit: Payer: 59

## 2011-04-21 ENCOUNTER — Other Ambulatory Visit: Payer: Self-pay | Admitting: Endocrinology

## 2011-04-21 ENCOUNTER — Ambulatory Visit
Admission: RE | Admit: 2011-04-21 | Discharge: 2011-04-21 | Disposition: A | Discharge status: Home / Self Care | Payer: 59 | Source: Ambulatory Visit | Attending: Endocrinology | Admitting: Endocrinology

## 2011-04-21 DIAGNOSIS — E049 Nontoxic goiter, unspecified: Secondary | ICD-10-CM

## 2011-05-29 ENCOUNTER — Telehealth: Payer: Self-pay | Admitting: Family Medicine

## 2011-05-29 NOTE — Telephone Encounter (Signed)
[facility] Triage Call Report Triage Record Num: [id number] Operator: Amy Head Patient Name: Maria Terrell Call Date & Time: 05/26/2011 10:08:08AM Patient Phone: [phone] PCP: Neta Mends. Panosh Patient Gender: Female PCP Fax : [phone] Patient DOB: [birth year] [facility] Name: [facility] Reason for Call: Caller: Elenor/Patient; PCP: Madelin Headings.; CB#: [phone]; Call regarding Diarrhea, gas, abd cramps GI Bug; Onset 05/24/11. Diarrhea is slowing down, Abd pain and gas has gotten worse. No N/V. Afebrile. Rates pain at a 8/10. Advised to be seen in ED. Protocol(s) Used: Diarrhea or Other Change in Bowel Habits Recommended Outcome per Protocol: See ED Immediately Reason for Outcome: Severe pain/cramping in abdomen or rectum that interferes with ability to carry out normal activities or sleep Care Advice: ~ Allow the patient to be in a position of comfort. ~ Another adult should drive. Call EMS 911 if signs and symptoms of shock develop (such as unable to stand due to faintness, dizziness, or lightheadedness; new onset of confusion; slow to respond or difficult to awaken; skin is pale, gray, cool, or moist to touch; severe weakness; loss of consciousness). ~ Change position slowly. Sit for a couple of minutes before standing to walk. Quick position changes may cause or worsen symptoms. ~ ~ IMMEDIATE ACTION Write down provider's name. List or place the following in a bag for transport with the patient: current prescription and/or nonprescription medications; alternative treatments, therapies and medications; and street drugs. ~ May have clear liquids (such as water, clear fruit juices without pulp, soda, tea or coffee without dairy or non-dairy creamer, clear broth or bouillon, oral hydration solution, or plain gelatin, fruit ices/popsicles, hard candy) but do not eat solid foods before being seen by your provider. ~ 05/26/2011 10:16:07AM Page 1 of 1 CAN

## 2011-08-18 ENCOUNTER — Other Ambulatory Visit: Payer: Self-pay | Admitting: Obstetrics and Gynecology

## 2011-08-18 ENCOUNTER — Other Ambulatory Visit: Payer: Self-pay | Admitting: Internal Medicine

## 2011-08-18 DIAGNOSIS — Z1231 Encounter for screening mammogram for malignant neoplasm of breast: Secondary | ICD-10-CM

## 2011-09-25 ENCOUNTER — Ambulatory Visit
Admission: RE | Admit: 2011-09-25 | Discharge: 2011-09-25 | Disposition: A | Discharge status: Home / Self Care | Payer: 59 | Source: Ambulatory Visit | Attending: Obstetrics and Gynecology | Admitting: Obstetrics and Gynecology

## 2011-09-25 DIAGNOSIS — Z1231 Encounter for screening mammogram for malignant neoplasm of breast: Secondary | ICD-10-CM

## 2011-09-28 ENCOUNTER — Other Ambulatory Visit: Payer: Self-pay | Admitting: Obstetrics and Gynecology

## 2011-09-28 DIAGNOSIS — R928 Other abnormal and inconclusive findings on diagnostic imaging of breast: Secondary | ICD-10-CM

## 2011-10-10 ENCOUNTER — Ambulatory Visit
Admission: RE | Admit: 2011-10-10 | Discharge: 2011-10-10 | Disposition: A | Discharge status: Home / Self Care | Payer: 59 | Source: Ambulatory Visit | Attending: Obstetrics and Gynecology | Admitting: Obstetrics and Gynecology

## 2011-10-10 DIAGNOSIS — R928 Other abnormal and inconclusive findings on diagnostic imaging of breast: Secondary | ICD-10-CM

## 2012-02-22 ENCOUNTER — Encounter: Payer: Self-pay | Admitting: Internal Medicine

## 2012-02-22 ENCOUNTER — Ambulatory Visit (INDEPENDENT_AMBULATORY_CARE_PROVIDER_SITE_OTHER): Payer: 59 | Admitting: Internal Medicine

## 2012-02-22 VITALS — BP 130/48 | Temp 98.6°F | Ht 69.0 in | Wt 274.0 lb

## 2012-02-22 DIAGNOSIS — J029 Acute pharyngitis, unspecified: Secondary | ICD-10-CM

## 2012-02-22 MED ORDER — AMOXICILLIN 875 MG PO TABS: 875.0000 mg | ORAL_TABLET | Freq: Two times a day (BID) | ORAL | Status: DC

## 2012-02-22 NOTE — Progress Notes (Signed)
  Subjective:    Patient ID: Maria Terrell, female    DOB: Nov 07, 1965, 46 y.o.   MRN: [id number]  HPI  46 year old white female complains of severe sore throat over last 48-72 hours. Symptoms started with mild sore throat and it has been progressively getting worse. She describes painful swallowing. She has minimal cough. She denies any fever. Her neck feels slightly tender.  Review of Systems Negative for sick contacts    Past Medical History  Diagnosis Date  . Allergy   . Depression   . Endometriosis   . Sleep apnea   . Thyroid goiter     History   Social History  . Marital Status: Single    Spouse Name: N/A    Number of Children: N/A  . Years of Education: N/A   Occupational History  . Not on file.   Social History Main Topics  . Smoking status: Never Smoker   . Smokeless tobacco: Not on file  . Alcohol Use: No  . Drug Use: No  . Sexually Active: Not on file   Other Topics Concern  . Not on file   Social History Narrative  . No narrative on file    Past Surgical History  Procedure Date  . Laparotomy     Family History  Problem Relation Age of Onset  . Heart disease Father     stent  . Sleep apnea Father   . Depression Brother     bipolar  . Hyperlipidemia    . Thyroid disease      No Known Allergies  No current outpatient prescriptions on file prior to visit.    BP 130/48  Temp 98.6 F (37 C) (Oral)  Ht 5\' 9"  (1.753 m)  Wt 274 lb (124.286 kg)  BMI 40.46 kg/m2    Objective:   Physical Exam  Constitutional: She appears well-developed and well-nourished.  HENT:  Head: Normocephalic and atraumatic.  Right Ear: External ear normal.  Left Ear: External ear normal.       Oropharyngeal erythema with right tonsillar exudate  Neck:       Mild bilateral neck tenderness  Cardiovascular: Normal rate, regular rhythm and normal heart sounds.   Pulmonary/Chest: Effort normal and breath sounds normal. She has no wheezes. She has no rales.    Lymphadenopathy:    She has no cervical adenopathy.  Skin: Skin is warm and dry.          Assessment & Plan:

## 2012-02-22 NOTE — Assessment & Plan Note (Signed)
46 year old white female with signs and symptoms of possible strep pharyngitis. Treat with amoxicillin 875 mg twice daily for 10 days. Patient also advised to gargle with warm salt water twice daily  Patient advised to call office if symptoms persist or worsen.

## 2012-02-22 NOTE — Patient Instructions (Signed)
Gargle with warm salt water twice daily Please call our office if your symptoms do not improve or gets worse.

## 2012-04-05 ENCOUNTER — Ambulatory Visit (INDEPENDENT_AMBULATORY_CARE_PROVIDER_SITE_OTHER): Payer: 59 | Admitting: Internal Medicine

## 2012-04-05 DIAGNOSIS — Z789 Other specified health status: Secondary | ICD-10-CM

## 2012-04-05 DIAGNOSIS — Z23 Encounter for immunization: Secondary | ICD-10-CM

## 2012-04-05 MED ORDER — ATOVAQUONE-PROGUANIL HCL 250-100 MG PO TABS: 1.0000 | ORAL_TABLET | Freq: Every day | ORAL | Status: DC

## 2012-04-05 MED ORDER — CIPROFLOXACIN HCL 500 MG PO TABS: 500.0000 mg | ORAL_TABLET | Freq: Two times a day (BID) | ORAL | Status: DC

## 2012-04-05 MED ORDER — CHLOROQUINE PHOSPHATE 250 MG PO TABS: 250.0000 mg | ORAL_TABLET | Freq: Every day | ORAL | Status: DC

## 2012-04-05 MED ORDER — FLUCONAZOLE 150 MG PO TABS: 150.0000 mg | ORAL_TABLET | Freq: Once | ORAL | Status: DC

## 2012-04-09 DIAGNOSIS — Z789 Other specified health status: Secondary | ICD-10-CM | POA: Insufficient documentation

## 2012-04-09 NOTE — Progress Notes (Signed)
This is a pretravel visit. She will be traveling to [place] next month. She's not been previously. Her itinerary will include a malarious region. She has no particular concerns.  Pretravel advice given including medical insurance with evacuation. Vaccines including Tdap and hepatitis a #1 given. She was given a prescription for typhoid oral vaccine. She was also given a prescription for Malarone and chloroquine for malaria prophylaxis. She is opting for Malarone is not too expensive to avoid the side effects of chloroquine.

## 2012-04-15 ENCOUNTER — Other Ambulatory Visit: Payer: Self-pay | Admitting: Obstetrics and Gynecology

## 2012-04-15 DIAGNOSIS — R922 Inconclusive mammogram: Secondary | ICD-10-CM

## 2012-04-16 ENCOUNTER — Ambulatory Visit (INDEPENDENT_AMBULATORY_CARE_PROVIDER_SITE_OTHER): Payer: 59 | Admitting: Sports Medicine

## 2012-04-16 VITALS — BP 135/85 | HR 59 | Ht 69.0 in | Wt 274.0 lb

## 2012-04-16 DIAGNOSIS — M722 Plantar fascial fibromatosis: Secondary | ICD-10-CM

## 2012-04-16 DIAGNOSIS — E669 Obesity, unspecified: Secondary | ICD-10-CM

## 2012-04-16 NOTE — Progress Notes (Signed)
  Subjective:    Patient ID: Kenzi Bardwell, female    DOB: [birth year], 47 y.o.   MRN: [id number]  HPI  Pt presents to clinic for evaluation of rt posterior and medial arch pain x 8 months. Had started increasing exercise and working with a trainer prior to heel pain starting last June. Saw Dr. Charlsie Merles was dx with bilat PF Rt >Lt - had bilat injections, left improved completely, rt got some better.   Has been resting for the last month, not able tolerate desired exercise program. Doing some biking. Uses night splint, and velcro arch support that she got from Dr. Beverlee Nims office. Also, using OTC orthtoics, superfeet, dr schol's and spinco.    Hx of bilateral PF R>L in 2008.  Trying to walk to lose weight   Review of Systems     Objective:   Physical Exam NAD/  Obese  Rt foot exam: Abnormal callusing on PIP 1st toe Good great toe motion ttp at medial insertion of plantar fascia   Lt foot Good great toe motion Slight callusing of PIP 1st toe  Standing  Loses long arch bilat  Slightly pronated bilat Good posterior tib function bilat No notable transverse arch collapse  Leg lengths equal   Walking gait is normal     Assessment & Plan:

## 2012-04-16 NOTE — Assessment & Plan Note (Signed)
Important to keep up some walking to try to lose weight  We will ease her into walking program  Rest may be counterproductive - if she gains weight PF will be worse

## 2012-04-16 NOTE — Patient Instructions (Addendum)
Please do suggested exercises and ice baths daily  Try arch supports  Ok to resume walking program  Please follow up in 6 weeks  Thank you for seeing [place] today!

## 2012-04-16 NOTE — Assessment & Plan Note (Signed)
I think total rest has not been helpful  Will start on some exercises and cross training  Resume some walking  Use arch strap  Modify arch supports used  Reck with [place] in 6 wks

## 2012-05-01 ENCOUNTER — Other Ambulatory Visit: Payer: Self-pay | Admitting: Endocrinology

## 2012-05-01 DIAGNOSIS — E049 Nontoxic goiter, unspecified: Secondary | ICD-10-CM

## 2012-05-02 ENCOUNTER — Telehealth: Payer: Self-pay | Admitting: *Deleted

## 2012-05-02 DIAGNOSIS — Z789 Other specified health status: Secondary | ICD-10-CM

## 2012-05-02 MED ORDER — TYPHOID VACCINE PO CPDR: 1.0000 | DELAYED_RELEASE_CAPSULE | ORAL | Status: DC

## 2012-05-02 NOTE — Telephone Encounter (Signed)
Looks like I did not print it out.  Please call it in, oral typhoid vaccine, 1 tab qod x 4 tabs. thanks

## 2012-05-02 NOTE — Telephone Encounter (Signed)
Rx sent and patient notified. Jacqueline Cockerham  

## 2012-05-02 NOTE — Telephone Encounter (Signed)
Patient called regarding her Rx for typhoid oral vaccine, it states in the note she was suppose to get this, but she did not. It is not on her med list. Patient uses [facility] on [address], please advise. Wendall Mola

## 2012-05-03 LAB — HM MAMMOGRAPHY

## 2012-05-09 ENCOUNTER — Ambulatory Visit
Admission: RE | Admit: 2012-05-09 | Discharge: 2012-05-09 | Disposition: A | Discharge status: Home / Self Care | Payer: 59 | Source: Ambulatory Visit | Attending: Obstetrics and Gynecology | Admitting: Obstetrics and Gynecology

## 2012-05-09 DIAGNOSIS — R922 Inconclusive mammogram: Secondary | ICD-10-CM

## 2012-05-31 ENCOUNTER — Ambulatory Visit
Admission: RE | Admit: 2012-05-31 | Discharge: 2012-05-31 | Disposition: A | Discharge status: Home / Self Care | Payer: 59 | Source: Ambulatory Visit | Attending: Endocrinology | Admitting: Endocrinology

## 2012-05-31 DIAGNOSIS — E049 Nontoxic goiter, unspecified: Secondary | ICD-10-CM

## 2012-06-11 ENCOUNTER — Ambulatory Visit: Payer: 59 | Admitting: Sports Medicine

## 2012-06-20 LAB — HM PAP SMEAR: HM Pap smear: NORMAL

## 2012-06-26 ENCOUNTER — Other Ambulatory Visit (INDEPENDENT_AMBULATORY_CARE_PROVIDER_SITE_OTHER): Payer: 59

## 2012-06-26 DIAGNOSIS — Z0000 Encounter for general adult medical examination without abnormal findings: Secondary | ICD-10-CM

## 2012-06-26 LAB — HEPATIC FUNCTION PANEL
ALT: 26 U/L (ref 0–35)
Bilirubin, Direct: 0.1 mg/dL (ref 0.0–0.3)
Total Bilirubin: 0.7 mg/dL (ref 0.3–1.2)

## 2012-06-26 LAB — CBC WITH DIFFERENTIAL/PLATELET
Eosinophils Relative: 2.7 % (ref 0.0–5.0)
HCT: 40.4 % (ref 36.0–46.0)
Lymphocytes Relative: 33 % (ref 12.0–46.0)
Lymphs Abs: 2.7 10*3/uL (ref 0.7–4.0)
Monocytes Relative: 6.1 % (ref 3.0–12.0)
Neutrophils Relative %: 57.6 % (ref 43.0–77.0)
Platelets: 305 10*3/uL (ref 150.0–400.0)
WBC: 8.3 10*3/uL (ref 4.5–10.5)

## 2012-06-26 LAB — BASIC METABOLIC PANEL
BUN: 13 mg/dL (ref 6–23)
GFR: 67.19 mL/min (ref >60.00)
Potassium: 4 mEq/L (ref 3.5–5.1)
Sodium: 138 mEq/L (ref 135–145)

## 2012-06-26 LAB — LIPID PANEL
Cholesterol: 181 mg/dL (ref 0–200)
HDL: 54.4 mg/dL (ref >39.00)
LDL Cholesterol: 113 mg/dL — ABNORMAL HIGH (ref 0–99)
Triglycerides: 70 mg/dL (ref 0.0–149.0)
VLDL: 14 mg/dL (ref 0.0–40.0)

## 2012-07-02 ENCOUNTER — Ambulatory Visit (INDEPENDENT_AMBULATORY_CARE_PROVIDER_SITE_OTHER): Payer: 59 | Admitting: Sports Medicine

## 2012-07-02 VITALS — BP 140/80 | Ht 68.0 in | Wt 275.0 lb

## 2012-07-02 DIAGNOSIS — E669 Obesity, unspecified: Secondary | ICD-10-CM

## 2012-07-02 DIAGNOSIS — M722 Plantar fascial fibromatosis: Secondary | ICD-10-CM

## 2012-07-02 NOTE — Assessment & Plan Note (Signed)
Making good progress  Continue insoles She was given sports insoles to try today  Keep up home exercises and stretches  Recheck 2-3 months

## 2012-07-02 NOTE — Assessment & Plan Note (Signed)
Working on a good exercise program  I advised her that we will not take her walking we will find her way to work around her foot pain

## 2012-07-02 NOTE — Progress Notes (Signed)
Patient ID: Maria Terrell, female   DOB: 03-12-1965, 47 y.o.   MRN: [id number]  Making some progress with her RT foot At least 30% better Able to walk up to 45 minutes now She has restarted some walking with her fitness training She feels that the modification which was a heel lift and a scaphoid pad that we made to her insoles this helps a lot  Works with Dr. Ardyth Gal After standing in surgery has anterior knee pain on rt No history of knee injury no significant swelling or giving way      Physical Exam: Pleasant obese female in no acute distress  RT Knee: Normal to inspection with no erythema or effusion or obvious bony abnormalities. Palpation normal with no warmth or joint line tenderness or patellar tenderness or condyle tenderness. ROM normal in flexion and extension and lower leg rotation except RT flexion is 10 deg less  Ligaments with solid consistent endpoints including ACL, PCL, LCL, MCL. Negative Mcmurray's and provocative meniscal tests. Non painful patellar compression. Crepitation and lateral tracking of patella  Patellar and quadriceps tendons unremarkable. Hamstring and quadriceps strength is normal.   Standing- cavus foot, arches have flattened mildly- long and trans Bunionettes bilat Not TTP over PF insertion Mil TTP in proximal RT arch  Assessment   Right knee pain consistent with early patellofemoral syndrome  Given a set of home exercises We will evaluate this if symptoms persist Patellar strap if needed in the future

## 2012-07-02 NOTE — Patient Instructions (Addendum)
Add step downs off front of step  Lateral step ups and cross over step ups  For knees: Quad sets Straight leg raise with toes pointed out Quad extensions holding ball in between knees   Continue regimen for plantar fasciitis   Please follow up in 2-3 months  Thank you for seeing [place] today!

## 2012-07-03 ENCOUNTER — Ambulatory Visit (INDEPENDENT_AMBULATORY_CARE_PROVIDER_SITE_OTHER): Payer: 59 | Admitting: Internal Medicine

## 2012-07-03 ENCOUNTER — Encounter: Payer: Self-pay | Admitting: Internal Medicine

## 2012-07-03 VITALS — BP 112/80 | HR 64 | Temp 98.5°F | Ht 68.25 in | Wt 272.0 lb

## 2012-07-03 DIAGNOSIS — Z0000 Encounter for general adult medical examination without abnormal findings: Secondary | ICD-10-CM

## 2012-07-03 DIAGNOSIS — E669 Obesity, unspecified: Secondary | ICD-10-CM

## 2012-07-03 DIAGNOSIS — E049 Nontoxic goiter, unspecified: Secondary | ICD-10-CM

## 2012-07-03 DIAGNOSIS — M722 Plantar fascial fibromatosis: Secondary | ICD-10-CM

## 2012-07-03 DIAGNOSIS — G4733 Obstructive sleep apnea (adult) (pediatric): Secondary | ICD-10-CM

## 2012-07-03 DIAGNOSIS — G4710 Hypersomnia, unspecified: Secondary | ICD-10-CM

## 2012-07-03 NOTE — Progress Notes (Signed)
Chief Complaint  Patient presents with  . Annual Exam    HPI: Patient comes in today for Preventive Health Care visit  Last ov with me was 2011 but has been under care for plantarfasciitis with Dr. Darrick Penna and   Dr Talmage Nap   for goiter and following nodules . And meds  q 6 months.   On ocps per her gynecologist. She is engaged to be married and asks opinion about pregnancy childbirth in her age group she is also discussed this with her gynecologist not interested in invasive intervention. Has a past history of endometriosis currently on OCPs.  She still is quite sleepy and tired although on CPAP. Sometimes dozes while she's sitting waiting in the car. Never when driving. Feels sleepy when she gets up in the morning to use the CPAP wonders if she should get a second opinion on the thyroid problem No current depression anxiety ROS:  GEN/ HEENT: No fever, significant weight changes sweats headaches vision problems hearing changes, CV/ PULM; No chest pain shortness of breath cough, syncope,edema  change in exercise tolerance. GI /GU: No adominal pain, vomiting, change in bowel habits. No blood in the stool. No significant GU symptoms. SKIN/HEME: ,no acute skin rashes suspicious lesions or bleeding. No lymphadenopathy, nodules, masses.  NEURO/ PSYCH:  No neurologic signs such as weakness numbness. No depression anxiety. IMM/ Allergy: No unusual infections.  Allergy .   REST of 12 system review negative except as per HPI   Past Medical History  Diagnosis Date  . Allergy   . Depression   . Endometriosis   . Sleep apnea   . Thyroid goiter     Family History  Problem Relation Age of Onset  . Heart disease Father     stent  . Sleep apnea Father   . Depression Brother     bipolar  . Hyperlipidemia    . Thyroid disease      History   Social History  . Marital Status: Single    Spouse Name: N/A    Number of Children: N/A  . Years of Education: N/A   Social History Main Topics  .  Smoking status: Never Smoker   . Smokeless tobacco: None  . Alcohol Use: No  . Drug Use: No  . Sexually Active: None   Other Topics Concern  . None   Social History Narrative   Single engaged to be married    [place] [employer]   Ft  Work    Neg tad     Outpatient Encounter Prescriptions as of 07/03/2012  Medication Sig Dispense Refill  . BIOTIN PO Take by mouth. Believes to be taking 600mg       . cetirizine (ZYRTEC) 10 MG tablet Take 10 mg by mouth daily.      . cholecalciferol (VITAMIN D) 1000 UNITS tablet Take 1,000 Units by mouth daily.      Marland Kitchen FLUoxetine (PROZAC) 20 MG tablet Take 20 mg by mouth daily.      Marland Kitchen levothyroxine (SYNTHROID, LEVOTHROID) 100 MCG tablet Take 100 mcg by mouth daily.      . meloxicam (MOBIC) 15 MG tablet Take 15 mg by mouth daily as needed.       . Multiple Vitamin (MULTIVITAMIN) tablet Take 1 tablet by mouth daily.      . Omega-3 Fatty Acids (FISH OIL) 1000 MG CPDR Take 1,000 mg by mouth daily.      . valACYclovir (VALTREX) 500 MG tablet Take 500 mg by mouth as  needed.      . [DISCONTINUED] atovaquone-proguanil (MALARONE) 250-100 MG TABS Take 1 tablet by mouth daily.  16 tablet  0  . [DISCONTINUED] chloroquine (ARALEN) 250 MG tablet Take 1 tablet (250 mg total) by mouth daily. Start 2 weeks prior to travel  8 tablet  0  . [DISCONTINUED] ciprofloxacin (CIPRO) 500 MG tablet Take 1 tablet (500 mg total) by mouth 2 (two) times daily.  10 tablet  0  . [DISCONTINUED] CRYSELLE-28 0.3-30 MG-MCG tablet Take 1 tablet by mouth daily.      . [DISCONTINUED] fluconazole (DIFLUCAN) 150 MG tablet Take 1 tablet (150 mg total) by mouth once. Take once for yeast infection  1 tablet  0  . [DISCONTINUED] typhoid (VIVOTIF) DR capsule Take 1 capsule by mouth every other day.  4 capsule  0   No facility-administered encounter medications on file as of 07/03/2012.    EXAM:  BP 112/80  Pulse 64  Temp(Src) 98.5 F (36.9 C) (Oral)  Ht 5' 8.25" (1.734 m)  Wt 272 lb (123.378 kg)  BMI  41.03 kg/m2  SpO2 97%  LMP 06/25/2012  Body mass index is 41.03 kg/(m^2).  Physical Exam: Vital signs reviewed ZOX:WRUE is a well-developed well-nourished alert cooperative   female who appears her stated age in no acute distress.  HEENT: normocephalic atraumatic , Eyes: PERRL EOM's full, conjunctiva clear, Nares: paten,t no deformity discharge or tenderness., Ears: no deformity EAC's clear TMs with normal landmarks. Mouth: clear OP, no lesions, edema.  Moist mucous membranes. Dentition in adequate repair. NECK: supple without masses,  or bruits. Thyroid palpable non tender CHEST/PULM:  Clear to auscultation and percussion breath sounds equal no wheeze , rales or rhonchi. No chest wall deformities or tenderness. Breast: normal by inspection . No dimpling, discharge, masses, tenderness or discharge . CV: PMI is nondisplaced, S1 S2 no gallops, murmurs, rubs. Peripheral pulses are full without delay.No JVD .  ABDOMEN: Bowel sounds normal nontender  No guard or rebound, no hepato splenomegal no CVA tenderness.  No hernia. Extremtities:  No clubbing cyanosis or edema, no acute joint swelling or redness no focal atrophy NEURO:  Oriented x3, cranial nerves 3-12 appear to be intact, no obvious focal weakness,gait within normal limits no abnormal reflexes or asymmetrical SKIN: No acute rashes normal turgor, color, no bruising or petechiae. PSYCH: Oriented, good eye contact, no obvious depression anxiety, cognition and judgment appear normal. LN: no cervical axillary inguinal adenopathy  Lab Results  Component Value Date   WBC 8.3 06/26/2012   HGB 13.9 06/26/2012   HCT 40.4 06/26/2012   PLT 305.0 06/26/2012   GLUCOSE 89 06/26/2012   CHOL 181 06/26/2012   TRIG 70.0 06/26/2012   HDL 54.40 06/26/2012   LDLDIRECT 132.0 02/17/2009   LDLCALC 113* 06/26/2012   ALT 26 06/26/2012   AST 23 06/26/2012   NA 138 06/26/2012   K 4.0 06/26/2012   CL 105 06/26/2012   CREATININE 1.0 06/26/2012   BUN 13 06/26/2012   CO2  26 06/26/2012   TSH 1.08 06/26/2012    ASSESSMENT AND PLAN:  Discussed the following assessment and plan:  Visit for preventive health examination  Goiter  Excessive sleepiness  OBSTRUCTIVE SLEEP APNEA  OBESITY  Plantar fasciitis of right foot Discussed above up-to-date her excessive sleepiness under treatment would have her see her sleep specialist again. I doubt that it is her thyroid medicine causing her problem. She has a history of iron deficiency but is currently not anemic. Questions answered as  best as possible Encouraged continued weight loss with a no processed foods diet and nutrition involvement. Avoiding excess sugars may help her sleepiness also although she has no evidence of diabetes. Would have her check up yearly or every other year or since she isseeing specialists more regularly at age 76 and above Patient Care Team: Madelin Headings, MD as PCP - General Enid Baas, MD (Sports Medicine) Dorisann Frames, MD as Attending Physician (Endocrinology) Zelphia Cairo, MD as Attending Physician (Obstetrics and Gynecology) Patient Instructions  Agree with non processed food eating in general  The fatigue is multifactorial butI think there is a sleep problem that makes it hard for you to feel rested ? Hypersomnia.     Of course weight loss helps energy also  .    Continue lifestyle intervention healthy eating and exercise .   Preventive Care for Adults, Female A healthy lifestyle and preventive care can promote health and wellness. Preventive health guidelines for women include the following key practices.  A routine yearly physical is a good way to check with your caregiver about your health and preventive screening. It is a chance to share any concerns and updates on your health, and to receive a thorough exam.  Visit your dentist for a routine exam and preventive care every 6 months. Brush your teeth twice a day and floss once a day. Good oral hygiene prevents  tooth decay and gum disease.  The frequency of eye exams is based on your age, health, family medical history, use of contact lenses, and other factors. Follow your caregiver's recommendations for frequency of eye exams.  Eat a healthy diet. Foods like vegetables, fruits, whole grains, low-fat dairy products, and lean protein foods contain the nutrients you need without too many calories. Decrease your intake of foods high in solid fats, added sugars, and salt. Eat the right amount of calories for you.Get information about a proper diet from your caregiver, if necessary.  Regular physical exercise is one of the most important things you can do for your health. Most adults should get at least 150 minutes of moderate-intensity exercise (any activity that increases your heart rate and causes you to sweat) each week. In addition, most adults need muscle-strengthening exercises on 2 or more days a week.  Maintain a healthy weight. The body mass index (BMI) is a screening tool to identify possible weight problems. It provides an estimate of body fat based on height and weight. Your caregiver can help determine your BMI, and can help you achieve or maintain a healthy weight.For adults 20 years and older:  A BMI below 18.5 is considered underweight.  A BMI of 18.5 to 24.9 is normal.  A BMI of 25 to 29.9 is considered overweight.  A BMI of 30 and above is considered obese.  Maintain normal blood lipids and cholesterol levels by exercising and minimizing your intake of saturated fat. Eat a balanced diet with plenty of fruit and vegetables. Blood tests for lipids and cholesterol should begin at age 73 and be repeated every 5 years. If your lipid or cholesterol levels are high, you are over 50, or you are at high risk for heart disease, you may need your cholesterol levels checked more frequently.Ongoing high lipid and cholesterol levels should be treated with medicines if diet and exercise are not  effective.  If you smoke, find out from your caregiver how to quit. If you do not use tobacco, do not start.  If you are pregnant, do not  drink alcohol. If you are breastfeeding, be very cautious about drinking alcohol. If you are not pregnant and choose to drink alcohol, do not exceed 1 drink per day. One drink is considered to be 12 ounces (355 mL) of beer, 5 ounces (148 mL) of wine, or 1.5 ounces (44 mL) of liquor.  Avoid use of street drugs. Do not share needles with anyone. Ask for help if you need support or instructions about stopping the use of drugs.  High blood pressure causes heart disease and increases the risk of stroke. Your blood pressure should be checked at least every 1 to 2 years. Ongoing high blood pressure should be treated with medicines if weight loss and exercise are not effective.  If you are 63 to 47 years old, ask your caregiver if you should take aspirin to prevent strokes.  Diabetes screening involves taking a blood sample to check your fasting blood sugar level. This should be done once every 3 years, after age 28, if you are within normal weight and without risk factors for diabetes. Testing should be considered at a younger age or be carried out more frequently if you are overweight and have at least 1 risk factor for diabetes.  Breast cancer screening is essential preventive care for women. You should practice "breast self-awareness." This means understanding the normal appearance and feel of your breasts and may include breast self-examination. Any changes detected, no matter how small, should be reported to a caregiver. Women in their 33s and 30s should have a clinical breast exam (CBE) by a caregiver as part of a regular health exam every 1 to 3 years. After age 40, women should have a CBE every year. Starting at age 29, women should consider having a mammography (breast X-ray test) every year. Women who have a family history of breast cancer should talk to their  caregiver about genetic screening. Women at a high risk of breast cancer should talk to their caregivers about having magnetic resonance imaging (MRI) and a mammography every year.  The Pap test is a screening test for cervical cancer. A Pap test can show cell changes on the cervix that might become cervical cancer if left untreated. A Pap test is a procedure in which cells are obtained and examined from the lower end of the uterus (cervix).  Women should have a Pap test starting at age 33.  Between ages 1 and 53, Pap tests should be repeated every 2 years.  Beginning at age 7, you should have a Pap test every 3 years as long as the past 3 Pap tests have been normal.  Some women have medical problems that increase the chance of getting cervical cancer. Talk to your caregiver about these problems. It is especially important to talk to your caregiver if a new problem develops soon after your last Pap test. In these cases, your caregiver may recommend more frequent screening and Pap tests.  The above recommendations are the same for women who have or have not gotten the vaccine for human papillomavirus (HPV).  If you had a hysterectomy for a problem that was not cancer or a condition that could lead to cancer, then you no longer need Pap tests. Even if you no longer need a Pap test, a regular exam is a good idea to make sure no other problems are starting.  If you are between ages 59 and 67, and you have had normal Pap tests going back 10 years, you no longer need Pap  tests. Even if you no longer need a Pap test, a regular exam is a good idea to make sure no other problems are starting.  If you have had past treatment for cervical cancer or a condition that could lead to cancer, you need Pap tests and screening for cancer for at least 20 years after your treatment.  If Pap tests have been discontinued, risk factors (such as a new sexual partner) need to be reassessed to determine if screening  should be resumed.  The HPV test is an additional test that may be used for cervical cancer screening. The HPV test looks for the virus that can cause the cell changes on the cervix. The cells collected during the Pap test can be tested for HPV. The HPV test could be used to screen women aged 59 years and older, and should be used in women of any age who have unclear Pap test results. After the age of 28, women should have HPV testing at the same frequency as a Pap test.  Colorectal cancer can be detected and often prevented. Most routine colorectal cancer screening begins at the age of 69 and continues through age 43. However, your caregiver may recommend screening at an earlier age if you have risk factors for colon cancer. On a yearly basis, your caregiver may provide home test kits to check for hidden blood in the stool. Use of a small camera at the end of a tube, to directly examine the colon (sigmoidoscopy or colonoscopy), can detect the earliest forms of colorectal cancer. Talk to your caregiver about this at age 85, when routine screening begins. Direct examination of the colon should be repeated every 5 to 10 years through age 8, unless early forms of pre-cancerous polyps or small growths are found.  Hepatitis C blood testing is recommended for all people born from [birth year] through 1965 and any individual with known risks for hepatitis C.  Practice safe sex. Use condoms and avoid high-risk sexual practices to reduce the spread of sexually transmitted infections (STIs). STIs include gonorrhea, chlamydia, syphilis, trichomonas, herpes, HPV, and human immunodeficiency virus (HIV). Herpes, HIV, and HPV are viral illnesses that have no cure. They can result in disability, cancer, and death. Sexually active women aged 57 and younger should be checked for chlamydia. Older women with new or multiple partners should also be tested for chlamydia. Testing for other STIs is recommended if you are sexually active  and at increased risk.  Osteoporosis is a disease in which the bones lose minerals and strength with aging. This can result in serious bone fractures. The risk of osteoporosis can be identified using a bone density scan. Women ages 8 and over and women at risk for fractures or osteoporosis should discuss screening with their caregivers. Ask your caregiver whether you should take a calcium supplement or vitamin D to reduce the rate of osteoporosis.  Menopause can be associated with physical symptoms and risks. Hormone replacement therapy is available to decrease symptoms and risks. You should talk to your caregiver about whether hormone replacement therapy is right for you.  Use sunscreen with sun protection factor (SPF) of 30 or more. Apply sunscreen liberally and repeatedly throughout the day. You should seek shade when your shadow is shorter than you. Protect yourself by wearing long sleeves, pants, a wide-brimmed hat, and sunglasses year round, whenever you are outdoors.  Once a month, do a whole body skin exam, using a mirror to look at the skin on your back.  Notify your caregiver of new moles, moles that have irregular borders, moles that are larger than a pencil eraser, or moles that have changed in shape or color.  Stay current with required immunizations.  Influenza. You need a dose every fall (or winter). The composition of the flu vaccine changes each year, so being vaccinated once is not enough.  Pneumococcal polysaccharide. You need 1 to 2 doses if you smoke cigarettes or if you have certain chronic medical conditions. You need 1 dose at age 37 (or older) if you have never been vaccinated.  Tetanus, diphtheria, pertussis (Tdap, Td). Get 1 dose of Tdap vaccine if you are younger than age 25, are over 53 and have contact with an infant, are a Research scientist (physical sciences), are pregnant, or simply want to be protected from whooping cough. After that, you need a Td booster dose every 10 years. Consult  your caregiver if you have not had at least 3 tetanus and diphtheria-containing shots sometime in your life or have a deep or dirty wound.  HPV. You need this vaccine if you are a woman age 56 or younger. The vaccine is given in 3 doses over 6 months.  Measles, mumps, rubella (MMR). You need at least 1 dose of MMR if you were born in 1957 or later. You may also need a second dose.  Meningococcal. If you are age 82 to 28 and a first-year college student living in a residence hall, or have one of several medical conditions, you need to get vaccinated against meningococcal disease. You may also need additional booster doses.  Zoster (shingles). If you are age 15 or older, you should get this vaccine.  Varicella (chickenpox). If you have never had chickenpox or you were vaccinated but received only 1 dose, talk to your caregiver to find out if you need this vaccine.  Hepatitis A. You need this vaccine if you have a specific risk factor for hepatitis A virus infection or you simply wish to be protected from this disease. The vaccine is usually given as 2 doses, 6 to 18 months apart.  Hepatitis B. You need this vaccine if you have a specific risk factor for hepatitis B virus infection or you simply wish to be protected from this disease. The vaccine is given in 3 doses, usually over 6 months. Preventive Services / Frequency Ages 40 to 28  Blood pressure check.** / Every 1 to 2 years.  Lipid and cholesterol check.** / Every 5 years beginning at age 65.  Clinical breast exam.** / Every 3 years for women in their 57s and 30s.  Pap test.** / Every 2 years from ages 75 through 33. Every 3 years starting at age 40 through age 36 or 75 with a history of 3 consecutive normal Pap tests.  HPV screening.** / Every 3 years from ages 30 through ages 53 to 51 with a history of 3 consecutive normal Pap tests.  Hepatitis C blood test.** / For any individual with known risks for hepatitis C.  Skin self-exam.  / Monthly.  Influenza immunization.** / Every year.  Pneumococcal polysaccharide immunization.** / 1 to 2 doses if you smoke cigarettes or if you have certain chronic medical conditions.  Tetanus, diphtheria, pertussis (Tdap, Td) immunization. / A one-time dose of Tdap vaccine. After that, you need a Td booster dose every 10 years.  HPV immunization. / 3 doses over 6 months, if you are 29 and younger.  Measles, mumps, rubella (MMR) immunization. / You need at least  1 dose of MMR if you were born in 1957 or later. You may also need a second dose.  Meningococcal immunization. / 1 dose if you are age 64 to 51 and a first-year college student living in a residence hall, or have one of several medical conditions, you need to get vaccinated against meningococcal disease. You may also need additional booster doses.  Varicella immunization.** / Consult your caregiver.  Hepatitis A immunization.** / Consult your caregiver. 2 doses, 6 to 18 months apart.  Hepatitis B immunization.** / Consult your caregiver. 3 doses usually over 6 months. Ages 58 to 80  Blood pressure check.** / Every 1 to 2 years.  Lipid and cholesterol check.** / Every 5 years beginning at age 8.  Clinical breast exam.** / Every year after age 55.  Mammogram.** / Every year beginning at age 37 and continuing for as long as you are in good health. Consult with your caregiver.  Pap test.** / Every 3 years starting at age 21 through age 67 or 55 with a history of 3 consecutive normal Pap tests.  HPV screening.** / Every 3 years from ages 81 through ages 24 to 14 with a history of 3 consecutive normal Pap tests.  Fecal occult blood test (FOBT) of stool. / Every year beginning at age 28 and continuing until age 59. You may not need to do this test if you get a colonoscopy every 10 years.  Flexible sigmoidoscopy or colonoscopy.** / Every 5 years for a flexible sigmoidoscopy or every 10 years for a colonoscopy beginning at age  20 and continuing until age 38.  Hepatitis C blood test.** / For all people born from [birth year] through 1965 and any individual with known risks for hepatitis C.  Skin self-exam. / Monthly.  Influenza immunization.** / Every year.  Pneumococcal polysaccharide immunization.** / 1 to 2 doses if you smoke cigarettes or if you have certain chronic medical conditions.  Tetanus, diphtheria, pertussis (Tdap, Td) immunization.** / A one-time dose of Tdap vaccine. After that, you need a Td booster dose every 10 years.  Measles, mumps, rubella (MMR) immunization. / You need at least 1 dose of MMR if you were born in 1957 or later. You may also need a second dose.  Varicella immunization.** / Consult your caregiver.  Meningococcal immunization.** / Consult your caregiver.  Hepatitis A immunization.** / Consult your caregiver. 2 doses, 6 to 18 months apart.  Hepatitis B immunization.** / Consult your caregiver. 3 doses, usually over 6 months. Ages 77 and over  Blood pressure check.** / Every 1 to 2 years.  Lipid and cholesterol check.** / Every 5 years beginning at age 62.  Clinical breast exam.** / Every year after age 21.  Mammogram.** / Every year beginning at age 7 and continuing for as long as you are in good health. Consult with your caregiver.  Pap test.** / Every 3 years starting at age 23 through age 56 or 3 with a 3 consecutive normal Pap tests. Testing can be stopped between 65 and 70 with 3 consecutive normal Pap tests and no abnormal Pap or HPV tests in the past 10 years.  HPV screening.** / Every 3 years from ages 30 through ages 44 or 65 with a history of 3 consecutive normal Pap tests. Testing can be stopped between 65 and 70 with 3 consecutive normal Pap tests and no abnormal Pap or HPV tests in the past 10 years.  Fecal occult blood test (FOBT) of stool. / Every year  beginning at age 15 and continuing until age 41. You may not need to do this test if you get a colonoscopy every  10 years.  Flexible sigmoidoscopy or colonoscopy.** / Every 5 years for a flexible sigmoidoscopy or every 10 years for a colonoscopy beginning at age 72 and continuing until age 69.  Hepatitis C blood test.** / For all people born from [birth year] through 1965 and any individual with known risks for hepatitis C.  Osteoporosis screening.** / A one-time screening for women ages 49 and over and women at risk for fractures or osteoporosis.  Skin self-exam. / Monthly.  Influenza immunization.** / Every year.  Pneumococcal polysaccharide immunization.** / 1 dose at age 75 (or older) if you have never been vaccinated.  Tetanus, diphtheria, pertussis (Tdap, Td) immunization. / A one-time dose of Tdap vaccine if you are over 65 and have contact with an infant, are a Research scientist (physical sciences), or simply want to be protected from whooping cough. After that, you need a Td booster dose every 10 years.  Varicella immunization.** / Consult your caregiver.  Meningococcal immunization.** / Consult your caregiver.  Hepatitis A immunization.** / Consult your caregiver. 2 doses, 6 to 18 months apart.  Hepatitis B immunization.** / Check with your caregiver. 3 doses, usually over 6 months. ** Family history and personal history of risk and conditions may change your caregiver's recommendations. Document Released: 04/11/2001 Document Revised: 05/08/2011 Document Reviewed: 07/11/2010 [facility] Patient Information 2013 [place], [place].     Neta Mends. Katheen Aslin M.D.

## 2012-07-03 NOTE — Patient Instructions (Signed)
Agree with non processed food eating in general  The fatigue is multifactorial butI think there is a sleep problem that makes it hard for you to feel rested ? Hypersomnia.     Of course weight loss helps energy also  .    Continue lifestyle intervention healthy eating and exercise .   Preventive Care for Adults, Female A healthy lifestyle and preventive care can promote health and wellness. Preventive health guidelines for women include the following key practices.  A routine yearly physical is a good way to check with your caregiver about your health and preventive screening. It is a chance to share any concerns and updates on your health, and to receive a thorough exam.  Visit your dentist for a routine exam and preventive care every 6 months. Brush your teeth twice a day and floss once a day. Good oral hygiene prevents tooth decay and gum disease.  The frequency of eye exams is based on your age, health, family medical history, use of contact lenses, and other factors. Follow your caregiver's recommendations for frequency of eye exams.  Eat a healthy diet. Foods like vegetables, fruits, whole grains, low-fat dairy products, and lean protein foods contain the nutrients you need without too many calories. Decrease your intake of foods high in solid fats, added sugars, and salt. Eat the right amount of calories for you.Get information about a proper diet from your caregiver, if necessary.  Regular physical exercise is one of the most important things you can do for your health. Most adults should get at least 150 minutes of moderate-intensity exercise (any activity that increases your heart rate and causes you to sweat) each week. In addition, most adults need muscle-strengthening exercises on 2 or more days a week.  Maintain a healthy weight. The body mass index (BMI) is a screening tool to identify possible weight problems. It provides an estimate of body fat based on height and weight. Your  caregiver can help determine your BMI, and can help you achieve or maintain a healthy weight.For adults 20 years and older:  A BMI below 18.5 is considered underweight.  A BMI of 18.5 to 24.9 is normal.  A BMI of 25 to 29.9 is considered overweight.  A BMI of 30 and above is considered obese.  Maintain normal blood lipids and cholesterol levels by exercising and minimizing your intake of saturated fat. Eat a balanced diet with plenty of fruit and vegetables. Blood tests for lipids and cholesterol should begin at age 80 and be repeated every 5 years. If your lipid or cholesterol levels are high, you are over 50, or you are at high risk for heart disease, you may need your cholesterol levels checked more frequently.Ongoing high lipid and cholesterol levels should be treated with medicines if diet and exercise are not effective.  If you smoke, find out from your caregiver how to quit. If you do not use tobacco, do not start.  If you are pregnant, do not drink alcohol. If you are breastfeeding, be very cautious about drinking alcohol. If you are not pregnant and choose to drink alcohol, do not exceed 1 drink per day. One drink is considered to be 12 ounces (355 mL) of beer, 5 ounces (148 mL) of wine, or 1.5 ounces (44 mL) of liquor.  Avoid use of street drugs. Do not share needles with anyone. Ask for help if you need support or instructions about stopping the use of drugs.  High blood pressure causes heart disease and  increases the risk of stroke. Your blood pressure should be checked at least every 1 to 2 years. Ongoing high blood pressure should be treated with medicines if weight loss and exercise are not effective.  If you are 44 to 47 years old, ask your caregiver if you should take aspirin to prevent strokes.  Diabetes screening involves taking a blood sample to check your fasting blood sugar level. This should be done once every 3 years, after age 79, if you are within normal weight and  without risk factors for diabetes. Testing should be considered at a younger age or be carried out more frequently if you are overweight and have at least 1 risk factor for diabetes.  Breast cancer screening is essential preventive care for women. You should practice "breast self-awareness." This means understanding the normal appearance and feel of your breasts and may include breast self-examination. Any changes detected, no matter how small, should be reported to a caregiver. Women in their 33s and 30s should have a clinical breast exam (CBE) by a caregiver as part of a regular health exam every 1 to 3 years. After age 28, women should have a CBE every year. Starting at age 13, women should consider having a mammography (breast X-ray test) every year. Women who have a family history of breast cancer should talk to their caregiver about genetic screening. Women at a high risk of breast cancer should talk to their caregivers about having magnetic resonance imaging (MRI) and a mammography every year.  The Pap test is a screening test for cervical cancer. A Pap test can show cell changes on the cervix that might become cervical cancer if left untreated. A Pap test is a procedure in which cells are obtained and examined from the lower end of the uterus (cervix).  Women should have a Pap test starting at age 63.  Between ages 40 and 55, Pap tests should be repeated every 2 years.  Beginning at age 80, you should have a Pap test every 3 years as long as the past 3 Pap tests have been normal.  Some women have medical problems that increase the chance of getting cervical cancer. Talk to your caregiver about these problems. It is especially important to talk to your caregiver if a new problem develops soon after your last Pap test. In these cases, your caregiver may recommend more frequent screening and Pap tests.  The above recommendations are the same for women who have or have not gotten the vaccine for  human papillomavirus (HPV).  If you had a hysterectomy for a problem that was not cancer or a condition that could lead to cancer, then you no longer need Pap tests. Even if you no longer need a Pap test, a regular exam is a good idea to make sure no other problems are starting.  If you are between ages 8 and 42, and you have had normal Pap tests going back 10 years, you no longer need Pap tests. Even if you no longer need a Pap test, a regular exam is a good idea to make sure no other problems are starting.  If you have had past treatment for cervical cancer or a condition that could lead to cancer, you need Pap tests and screening for cancer for at least 20 years after your treatment.  If Pap tests have been discontinued, risk factors (such as a new sexual partner) need to be reassessed to determine if screening should be resumed.  The HPV  test is an additional test that may be used for cervical cancer screening. The HPV test looks for the virus that can cause the cell changes on the cervix. The cells collected during the Pap test can be tested for HPV. The HPV test could be used to screen women aged 28 years and older, and should be used in women of any age who have unclear Pap test results. After the age of 17, women should have HPV testing at the same frequency as a Pap test.  Colorectal cancer can be detected and often prevented. Most routine colorectal cancer screening begins at the age of 78 and continues through age 4. However, your caregiver may recommend screening at an earlier age if you have risk factors for colon cancer. On a yearly basis, your caregiver may provide home test kits to check for hidden blood in the stool. Use of a small camera at the end of a tube, to directly examine the colon (sigmoidoscopy or colonoscopy), can detect the earliest forms of colorectal cancer. Talk to your caregiver about this at age 56, when routine screening begins. Direct examination of the colon should  be repeated every 5 to 10 years through age 71, unless early forms of pre-cancerous polyps or small growths are found.  Hepatitis C blood testing is recommended for all people born from [birth year] through 1965 and any individual with known risks for hepatitis C.  Practice safe sex. Use condoms and avoid high-risk sexual practices to reduce the spread of sexually transmitted infections (STIs). STIs include gonorrhea, chlamydia, syphilis, trichomonas, herpes, HPV, and human immunodeficiency virus (HIV). Herpes, HIV, and HPV are viral illnesses that have no cure. They can result in disability, cancer, and death. Sexually active women aged 38 and younger should be checked for chlamydia. Older women with new or multiple partners should also be tested for chlamydia. Testing for other STIs is recommended if you are sexually active and at increased risk.  Osteoporosis is a disease in which the bones lose minerals and strength with aging. This can result in serious bone fractures. The risk of osteoporosis can be identified using a bone density scan. Women ages 84 and over and women at risk for fractures or osteoporosis should discuss screening with their caregivers. Ask your caregiver whether you should take a calcium supplement or vitamin D to reduce the rate of osteoporosis.  Menopause can be associated with physical symptoms and risks. Hormone replacement therapy is available to decrease symptoms and risks. You should talk to your caregiver about whether hormone replacement therapy is right for you.  Use sunscreen with sun protection factor (SPF) of 30 or more. Apply sunscreen liberally and repeatedly throughout the day. You should seek shade when your shadow is shorter than you. Protect yourself by wearing long sleeves, pants, a wide-brimmed hat, and sunglasses year round, whenever you are outdoors.  Once a month, do a whole body skin exam, using a mirror to look at the skin on your back. Notify your caregiver of  new moles, moles that have irregular borders, moles that are larger than a pencil eraser, or moles that have changed in shape or color.  Stay current with required immunizations.  Influenza. You need a dose every fall (or winter). The composition of the flu vaccine changes each year, so being vaccinated once is not enough.  Pneumococcal polysaccharide. You need 1 to 2 doses if you smoke cigarettes or if you have certain chronic medical conditions. You need 1 dose at age 79 (  or older) if you have never been vaccinated.  Tetanus, diphtheria, pertussis (Tdap, Td). Get 1 dose of Tdap vaccine if you are younger than age 28, are over 56 and have contact with an infant, are a Research scientist (physical sciences), are pregnant, or simply want to be protected from whooping cough. After that, you need a Td booster dose every 10 years. Consult your caregiver if you have not had at least 3 tetanus and diphtheria-containing shots sometime in your life or have a deep or dirty wound.  HPV. You need this vaccine if you are a woman age 25 or younger. The vaccine is given in 3 doses over 6 months.  Measles, mumps, rubella (MMR). You need at least 1 dose of MMR if you were born in 1957 or later. You may also need a second dose.  Meningococcal. If you are age 36 to 22 and a first-year college student living in a residence hall, or have one of several medical conditions, you need to get vaccinated against meningococcal disease. You may also need additional booster doses.  Zoster (shingles). If you are age 77 or older, you should get this vaccine.  Varicella (chickenpox). If you have never had chickenpox or you were vaccinated but received only 1 dose, talk to your caregiver to find out if you need this vaccine.  Hepatitis A. You need this vaccine if you have a specific risk factor for hepatitis A virus infection or you simply wish to be protected from this disease. The vaccine is usually given as 2 doses, 6 to 18 months  apart.  Hepatitis B. You need this vaccine if you have a specific risk factor for hepatitis B virus infection or you simply wish to be protected from this disease. The vaccine is given in 3 doses, usually over 6 months. Preventive Services / Frequency Ages 71 to 6  Blood pressure check.** / Every 1 to 2 years.  Lipid and cholesterol check.** / Every 5 years beginning at age 64.  Clinical breast exam.** / Every 3 years for women in their 52s and 30s.  Pap test.** / Every 2 years from ages 33 through 35. Every 3 years starting at age 22 through age 35 or 58 with a history of 3 consecutive normal Pap tests.  HPV screening.** / Every 3 years from ages 34 through ages 16 to 20 with a history of 3 consecutive normal Pap tests.  Hepatitis C blood test.** / For any individual with known risks for hepatitis C.  Skin self-exam. / Monthly.  Influenza immunization.** / Every year.  Pneumococcal polysaccharide immunization.** / 1 to 2 doses if you smoke cigarettes or if you have certain chronic medical conditions.  Tetanus, diphtheria, pertussis (Tdap, Td) immunization. / A one-time dose of Tdap vaccine. After that, you need a Td booster dose every 10 years.  HPV immunization. / 3 doses over 6 months, if you are 76 and younger.  Measles, mumps, rubella (MMR) immunization. / You need at least 1 dose of MMR if you were born in 1957 or later. You may also need a second dose.  Meningococcal immunization. / 1 dose if you are age 35 to 33 and a first-year college student living in a residence hall, or have one of several medical conditions, you need to get vaccinated against meningococcal disease. You may also need additional booster doses.  Varicella immunization.** / Consult your caregiver.  Hepatitis A immunization.** / Consult your caregiver. 2 doses, 6 to 18 months apart.  Hepatitis B  immunization.** / Consult your caregiver. 3 doses usually over 6 months. Ages 98 to 62  Blood pressure  check.** / Every 1 to 2 years.  Lipid and cholesterol check.** / Every 5 years beginning at age 71.  Clinical breast exam.** / Every year after age 38.  Mammogram.** / Every year beginning at age 47 and continuing for as long as you are in good health. Consult with your caregiver.  Pap test.** / Every 3 years starting at age 39 through age 84 or 17 with a history of 3 consecutive normal Pap tests.  HPV screening.** / Every 3 years from ages 78 through ages 65 to 54 with a history of 3 consecutive normal Pap tests.  Fecal occult blood test (FOBT) of stool. / Every year beginning at age 73 and continuing until age 3. You may not need to do this test if you get a colonoscopy every 10 years.  Flexible sigmoidoscopy or colonoscopy.** / Every 5 years for a flexible sigmoidoscopy or every 10 years for a colonoscopy beginning at age 42 and continuing until age 79.  Hepatitis C blood test.** / For all people born from [birth year] through 1965 and any individual with known risks for hepatitis C.  Skin self-exam. / Monthly.  Influenza immunization.** / Every year.  Pneumococcal polysaccharide immunization.** / 1 to 2 doses if you smoke cigarettes or if you have certain chronic medical conditions.  Tetanus, diphtheria, pertussis (Tdap, Td) immunization.** / A one-time dose of Tdap vaccine. After that, you need a Td booster dose every 10 years.  Measles, mumps, rubella (MMR) immunization. / You need at least 1 dose of MMR if you were born in 1957 or later. You may also need a second dose.  Varicella immunization.** / Consult your caregiver.  Meningococcal immunization.** / Consult your caregiver.  Hepatitis A immunization.** / Consult your caregiver. 2 doses, 6 to 18 months apart.  Hepatitis B immunization.** / Consult your caregiver. 3 doses, usually over 6 months. Ages 30 and over  Blood pressure check.** / Every 1 to 2 years.  Lipid and cholesterol check.** / Every 5 years beginning at age  16.  Clinical breast exam.** / Every year after age 66.  Mammogram.** / Every year beginning at age 50 and continuing for as long as you are in good health. Consult with your caregiver.  Pap test.** / Every 3 years starting at age 43 through age 29 or 60 with a 3 consecutive normal Pap tests. Testing can be stopped between 65 and 70 with 3 consecutive normal Pap tests and no abnormal Pap or HPV tests in the past 10 years.  HPV screening.** / Every 3 years from ages 64 through ages 52 or 84 with a history of 3 consecutive normal Pap tests. Testing can be stopped between 65 and 70 with 3 consecutive normal Pap tests and no abnormal Pap or HPV tests in the past 10 years.  Fecal occult blood test (FOBT) of stool. / Every year beginning at age 47 and continuing until age 31. You may not need to do this test if you get a colonoscopy every 10 years.  Flexible sigmoidoscopy or colonoscopy.** / Every 5 years for a flexible sigmoidoscopy or every 10 years for a colonoscopy beginning at age 33 and continuing until age 53.  Hepatitis C blood test.** / For all people born from [birth year] through 1965 and any individual with known risks for hepatitis C.  Osteoporosis screening.** / A one-time screening for women ages 88  and over and women at risk for fractures or osteoporosis.  Skin self-exam. / Monthly.  Influenza immunization.** / Every year.  Pneumococcal polysaccharide immunization.** / 1 dose at age 56 (or older) if you have never been vaccinated.  Tetanus, diphtheria, pertussis (Tdap, Td) immunization. / A one-time dose of Tdap vaccine if you are over 65 and have contact with an infant, are a Research scientist (physical sciences), or simply want to be protected from whooping cough. After that, you need a Td booster dose every 10 years.  Varicella immunization.** / Consult your caregiver.  Meningococcal immunization.** / Consult your caregiver.  Hepatitis A immunization.** / Consult your caregiver. 2 doses, 6 to 18  months apart.  Hepatitis B immunization.** / Check with your caregiver. 3 doses, usually over 6 months. ** Family history and personal history of risk and conditions may change your caregiver's recommendations. Document Released: 04/11/2001 Document Revised: 05/08/2011 Document Reviewed: 07/11/2010 [facility] Patient Information 2013 [place], [place].

## 2012-09-17 ENCOUNTER — Ambulatory Visit: Payer: 59 | Admitting: Sports Medicine

## 2012-10-09 ENCOUNTER — Ambulatory Visit (INDEPENDENT_AMBULATORY_CARE_PROVIDER_SITE_OTHER): Payer: 59 | Admitting: Sports Medicine

## 2012-10-09 VITALS — BP 114/78 | Ht 68.0 in | Wt 275.0 lb

## 2012-10-09 DIAGNOSIS — M25569 Pain in unspecified knee: Secondary | ICD-10-CM

## 2012-10-09 DIAGNOSIS — M222X1 Patellofemoral disorders, right knee: Secondary | ICD-10-CM

## 2012-10-09 DIAGNOSIS — M722 Plantar fascial fibromatosis: Secondary | ICD-10-CM

## 2012-10-09 NOTE — Progress Notes (Signed)
CC: Followup right plantar fasciitis and right knee pain HPI: Patient is a pleasant 33 are old female who presents for followup of the above. With regards to her plantar fasciitis she says that she feels she is greater than 75% better. She only really has pain first thing in the morning when she gets out of bed. Otherwise she is able to perform her job without discomfort. She is wearing the arch supports with heel lift and scaphoid pad and feels that they're helpful. She states she is walking 30-45 minutes 2-3 times per week. Overall she is very pleased with her progress With regards to her right knee pain, she says that overall she feels like she is doing better. She has had no further sensations of instability like the knee is going to give out. She still does have some occasional aching pain in the anterior aspect of the knee. She thinks the initial injury may have occurred when she was playing tennis and twisted hernia little bit. She is doing the home exercises 2 times per week.  ROS: As above in the HPI. All other systems are stable or negative.  OBJECTIVE: APPEARANCE:  Patient in no acute distress.The patient appeared well nourished and normally developed. HEENT: No scleral icterus. Conjunctiva non-injected Resp: Non labored Skin: No rash MSK:  Right Knee - Inspection normal with no erythema or effusion or obvious bony abnormalities.  - Palpation with very mild medial joint line tenderness. No TTP at patellar or quad tendon.  - ROM normal in flexion and extension. - Strength 5/5 in flexion and extension. - Ligaments with solid consistent endpoints including ACL, PCL, LCL, MCL.  - Negative Mcmurray's. Negative thessaly - Non painful patellar compression.  - Neurovascularly intact Right Foot: Normal inspection with no visable or palpable fat pad atrophy and no visible swelling/erythema. Patient is tender at medial insertion of plantar fascia into calcaneus.  ASSESSMENT: #1. Right  plantar fasciitis, improving #2. Right knee pain due to early patellofemoral syndrome, improving   PLAN: At this point, patient is doing well with conservative therapy. For her plantar fasciitis she was encouraged to do heel stretches first thing in the morning before she gets out of bed to help with her pain in her first few steps. She should continue to use the arch supports and home exercises. We've encouraged her to continue walking with a goal of at least 5 days per week as weight loss will help her with her symptoms. She is to return to have new arch supports made when her current pair wears out.  For her knee pain, suspect patellofemoral pain. She should continue to work on a home exercises for quad strengthening. Also recommended stationary bicycle for crosstraining as this will also assist with quad strengthening and VMO strength. At this point she has no exam findings suggestive of meniscal injury.  She will followup as needed.

## 2012-10-09 NOTE — Patient Instructions (Signed)
Thank you for coming in today  1. Please try stretching your heel with a towel first thing in the morning 2. Continue to do your home exercises. Try to do them at least 3 x per week 3. Try to work up to exercising 5 days per week 4. Followup when you need new insoles 5. For your low back do:  - single knee to chest  - both knees to chest  - knee to opposite shoulder

## 2012-10-17 ENCOUNTER — Other Ambulatory Visit: Payer: Self-pay

## 2012-10-17 DIAGNOSIS — Z1231 Encounter for screening mammogram for malignant neoplasm of breast: Secondary | ICD-10-CM

## 2012-10-30 ENCOUNTER — Telehealth: Payer: Self-pay | Admitting: *Deleted

## 2012-10-30 NOTE — Telephone Encounter (Signed)
Left a message on voicemail asking the patient if she could come Friday at 9:30 for a nurse visit for her 2nd Hep A shot.  If the patient has no other travel medicine-related needs, she can be moved to the nurse schedule. Andree Coss, RN

## 2012-11-01 ENCOUNTER — Encounter: Payer: 59 | Admitting: Internal Medicine

## 2012-11-01 ENCOUNTER — Ambulatory Visit (INDEPENDENT_AMBULATORY_CARE_PROVIDER_SITE_OTHER): Payer: 59 | Admitting: *Deleted

## 2012-11-01 DIAGNOSIS — Z23 Encounter for immunization: Secondary | ICD-10-CM

## 2012-11-22 ENCOUNTER — Ambulatory Visit: Payer: 59

## 2012-12-06 ENCOUNTER — Telehealth: Payer: Self-pay | Admitting: Internal Medicine

## 2012-12-06 MED ORDER — VALACYCLOVIR HCL 1 G PO TABS: 2000.0000 mg | ORAL_TABLET | Freq: Two times a day (BID) | ORAL | Status: DC

## 2012-12-06 NOTE — Telephone Encounter (Signed)
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   Pt currently has outbreak and is taking Valtrex 1000mg  for 3 days, written by MD at her place of work. Pt is getting married 10-25 and would like to know if MD would write low dose Valtrex for Pt to take daily until after wedding. All emergent sxs ruled out per Cold Sores protocol in [facility], see in 72 hrs d/t severe pain unresponsive to home care. Pt last OV was May 2014.  Pt uses [facility], [address], [phone].  Please review w/ MD and f/u w/ Pt.  Symptoms  Reason For Call & Symptoms: Cold Sore question regarding Valtrex.  Reviewed Health History In EMR: Yes  Reviewed Medications In EMR: Yes  Reviewed Allergies In EMR: Yes  Reviewed Surgeries / Procedures: Yes  Date of Onset of Symptoms: 12/06/2012  Treatments Tried: Valtrex 1000gm  Treatments Tried Worked: No OB / GYN:  LMP: 11/06/2012  Guideline(s) Used:  No Protocol Available - Information Only  Disposition Per Guideline:   Discuss with PCP and Callback by Nurse Today  Reason For Disposition Reached:   Nursing judgment  Advice Given:  N/A  Patient Will Follow Care Advice:  YES

## 2012-12-06 NOTE — Telephone Encounter (Signed)
Patient notified to pick up at the pharmacy. 

## 2012-12-06 NOTE — Telephone Encounter (Signed)
1000 2 po bid disp 30 . Refill x 1

## 2012-12-06 NOTE — Telephone Encounter (Signed)
1000mg   2 tabs twice daily?

## 2012-12-13 ENCOUNTER — Ambulatory Visit
Admission: RE | Admit: 2012-12-13 | Discharge: 2012-12-13 | Disposition: A | Discharge status: Home / Self Care | Payer: 59 | Source: Ambulatory Visit

## 2012-12-13 DIAGNOSIS — Z1231 Encounter for screening mammogram for malignant neoplasm of breast: Secondary | ICD-10-CM

## 2013-01-02 ENCOUNTER — Other Ambulatory Visit: Payer: Self-pay

## 2013-05-01 ENCOUNTER — Other Ambulatory Visit: Payer: Self-pay | Admitting: Endocrinology

## 2013-05-01 DIAGNOSIS — E049 Nontoxic goiter, unspecified: Secondary | ICD-10-CM

## 2013-05-14 ENCOUNTER — Telehealth: Payer: Self-pay | Admitting: Internal Medicine

## 2013-05-14 NOTE — Telephone Encounter (Signed)
Pt would like to add vitamin d to her [facility] labs. Is that ok?

## 2013-05-15 NOTE — Telephone Encounter (Signed)
Ok   To do Uncertain if insurance will  Pay for this

## 2013-05-16 ENCOUNTER — Other Ambulatory Visit: Payer: Self-pay | Admitting: Family Medicine

## 2013-05-16 DIAGNOSIS — Z0000 Encounter for general adult medical examination without abnormal findings: Secondary | ICD-10-CM

## 2013-05-16 DIAGNOSIS — R5383 Other fatigue: Secondary | ICD-10-CM

## 2013-05-16 DIAGNOSIS — R5381 Other malaise: Secondary | ICD-10-CM

## 2013-05-16 NOTE — Telephone Encounter (Signed)
Vitamin D added to orders.

## 2013-05-30 ENCOUNTER — Other Ambulatory Visit: Payer: Self-pay | Admitting: Internal Medicine

## 2013-06-13 ENCOUNTER — Ambulatory Visit (INDEPENDENT_AMBULATORY_CARE_PROVIDER_SITE_OTHER): Payer: 59 | Admitting: Podiatry

## 2013-06-13 ENCOUNTER — Ambulatory Visit (INDEPENDENT_AMBULATORY_CARE_PROVIDER_SITE_OTHER): Payer: 59

## 2013-06-13 ENCOUNTER — Encounter: Payer: Self-pay | Admitting: Podiatry

## 2013-06-13 VITALS — Resp 16 | Ht 69.0 in | Wt 296.0 lb

## 2013-06-13 DIAGNOSIS — M79673 Pain in unspecified foot: Secondary | ICD-10-CM

## 2013-06-13 DIAGNOSIS — M79609 Pain in unspecified limb: Secondary | ICD-10-CM

## 2013-06-13 DIAGNOSIS — M7660 Achilles tendinitis, unspecified leg: Secondary | ICD-10-CM

## 2013-06-13 DIAGNOSIS — M7730 Calcaneal spur, unspecified foot: Secondary | ICD-10-CM

## 2013-06-13 MED ORDER — TRIAMCINOLONE ACETONIDE 10 MG/ML IJ SUSP
10.0000 mg | Freq: Once | INTRAMUSCULAR | Status: AC
  Administered 2013-06-13: 10 mg

## 2013-06-13 NOTE — Patient Instructions (Signed)

## 2013-06-15 NOTE — Progress Notes (Signed)
Subjective:     Patient ID: Maria Terrell, female   DOB: 17-Aug-1965, 48 y.o.   MRN: [id number]  HPI patient states that I'm having a lot of pain in my left heel and it has gotten worse over the last few weeks. States that she was doing okay and now has developed increased problems with this   Review of Systems     Objective:   Physical Exam Neurovascular status unchanged with significant discomfort in the plantar heel left at the insertional point of the tendon into the calcaneus    Assessment:     Plantar fasciitis of the left heel with inflammation and fluid buildup noted    Plan:     H&P performed and conditions discussed and explained with patient. Injected the left plantar fascia 3 mg Kenalog 5 mm I can Marcaine mixture advised him reduced activity and discussed possible treatments in the future. Reappoint her recheck

## 2013-06-27 ENCOUNTER — Other Ambulatory Visit: Payer: 59

## 2013-07-09 ENCOUNTER — Encounter: Payer: 59 | Admitting: Internal Medicine

## 2013-07-10 ENCOUNTER — Encounter: Payer: Self-pay | Admitting: Sports Medicine

## 2013-07-10 ENCOUNTER — Ambulatory Visit (INDEPENDENT_AMBULATORY_CARE_PROVIDER_SITE_OTHER): Payer: 59 | Admitting: Sports Medicine

## 2013-07-10 VITALS — BP 142/80 | HR 62 | Ht 69.0 in | Wt 299.0 lb

## 2013-07-10 DIAGNOSIS — M25579 Pain in unspecified ankle and joints of unspecified foot: Secondary | ICD-10-CM

## 2013-07-10 DIAGNOSIS — M722 Plantar fascial fibromatosis: Secondary | ICD-10-CM

## 2013-07-10 NOTE — Assessment & Plan Note (Signed)
Compared to her prior evaluation her plantar fasciitis much less painful  There still some changes of chronic irritation around the spur based on her ultrasound  We will continue using arch strap and add orthotic use for activities

## 2013-07-10 NOTE — Assessment & Plan Note (Signed)
Since she didn't improve in sports insoles but is now having more pain we will try to go ahead and make custom orthotics  Patient was fitted for a : standard, cushioned, semi-rigid orthotic. The orthotic was heated and afterward the patient stood on the orthotic blank positioned on the orthotic stand. The patient was positioned in subtalar neutral position and 10 degrees of ankle dorsiflexion in a weight bearing stance. After completion of molding, a stable base was applied to the orthotic blank. The blank was ground to a stable position for weight bearing. Size: 10 Red EVA Base: blue EVA Posting: none Additional orthotic padding: none  Preparation and evaluation time was 45 minutes  Gait was more comfortable and was in a neutral position once the orthotics were completed  She will use these consistently but if she is not getting much pain relief will return for reevaluation

## 2013-07-10 NOTE — Progress Notes (Signed)
Patient ID: Maria LivingSusan K Terrell, female   DOB: 1965/10/21, 48 y.o.   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 Dellia Nimsiegel and was treated for Achilles issues with an injection That may have helped some but she says the Achilles is not the primary area of pain  She comes for my opinions as the pain has persisted now over a month since the last treatment  Physical examination Pleasant female in no acute distress BP 142/80  Pulse 62  Ht 5\' 9"  (1.753 m)  Wt 299 lb (135.626 kg)  BMI 44.13 kg/m2  Palpation of the foot bilaterally reveals no tenderness over either Achilles tendon The plantar fascia is nontender today on palpation There is some ecchymosis along the lateral aspect of the left heel Both heels show some piezogenic papules No other swelling is noted  MSK ultrasound Plantar fascia on right and left is normal thickness There is a superior small spur on the left and slightly larger one on the right There is an area of some hypoechoic swelling just past the spur on the right  Achilles tendon bilaterally is completely normal Retrocalcaneal bursal bilaterally is normal  Along the heel there is some swelling in the capsule of the left calcaneus lateral margin that looks cystic

## 2013-09-04 ENCOUNTER — Other Ambulatory Visit: Payer: Self-pay | Admitting: Endocrinology

## 2013-09-04 DIAGNOSIS — E049 Nontoxic goiter, unspecified: Secondary | ICD-10-CM

## 2013-11-17 ENCOUNTER — Other Ambulatory Visit: Payer: Self-pay

## 2013-11-17 DIAGNOSIS — Z1231 Encounter for screening mammogram for malignant neoplasm of breast: Secondary | ICD-10-CM

## 2013-11-28 ENCOUNTER — Ambulatory Visit: Payer: 59 | Admitting: Dietician

## 2013-12-26 ENCOUNTER — Ambulatory Visit
Admission: RE | Admit: 2013-12-26 | Discharge: 2013-12-26 | Disposition: A | Discharge status: Home / Self Care | Payer: 59 | Source: Ambulatory Visit

## 2013-12-26 DIAGNOSIS — Z1231 Encounter for screening mammogram for malignant neoplasm of breast: Secondary | ICD-10-CM

## 2014-04-28 ENCOUNTER — Other Ambulatory Visit: Payer: Self-pay

## 2014-04-28 ENCOUNTER — Other Ambulatory Visit: Payer: 59

## 2014-07-07 ENCOUNTER — Other Ambulatory Visit: Payer: Self-pay | Admitting: Obstetrics and Gynecology

## 2014-07-08 LAB — CYTOLOGY - PAP

## 2014-08-17 ENCOUNTER — Other Ambulatory Visit: Payer: Self-pay | Admitting: Obstetrics and Gynecology

## 2014-08-28 ENCOUNTER — Other Ambulatory Visit: Payer: 59

## 2014-09-02 ENCOUNTER — Other Ambulatory Visit: Payer: Self-pay | Admitting: Rehabilitation

## 2014-09-02 DIAGNOSIS — M47817 Spondylosis without myelopathy or radiculopathy, lumbosacral region: Secondary | ICD-10-CM

## 2014-12-21 ENCOUNTER — Other Ambulatory Visit: Payer: Self-pay

## 2014-12-21 DIAGNOSIS — Z1231 Encounter for screening mammogram for malignant neoplasm of breast: Secondary | ICD-10-CM

## 2014-12-29 HISTORY — PX: LAPAROSCOPIC GASTRIC BAND REMOVAL WITH LAPAROSCOPIC GASTRIC SLEEVE RESECTION: SHX6498

## 2015-01-18 ENCOUNTER — Ambulatory Visit
Admission: RE | Admit: 2015-01-18 | Discharge: 2015-01-18 | Disposition: A | Discharge status: Home / Self Care | Payer: 59 | Source: Ambulatory Visit

## 2015-01-18 DIAGNOSIS — Z1231 Encounter for screening mammogram for malignant neoplasm of breast: Secondary | ICD-10-CM

## 2015-01-20 ENCOUNTER — Other Ambulatory Visit: Payer: Self-pay | Admitting: Obstetrics and Gynecology

## 2015-01-20 DIAGNOSIS — R928 Other abnormal and inconclusive findings on diagnostic imaging of breast: Secondary | ICD-10-CM

## 2015-02-19 ENCOUNTER — Ambulatory Visit
Admission: RE | Admit: 2015-02-19 | Discharge: 2015-02-19 | Disposition: A | Discharge status: Home / Self Care | Payer: 59 | Source: Ambulatory Visit | Attending: Obstetrics and Gynecology | Admitting: Obstetrics and Gynecology

## 2015-02-19 DIAGNOSIS — R928 Other abnormal and inconclusive findings on diagnostic imaging of breast: Secondary | ICD-10-CM

## 2015-11-02 ENCOUNTER — Other Ambulatory Visit: Payer: Self-pay | Admitting: Family Medicine

## 2015-11-02 DIAGNOSIS — Z1231 Encounter for screening mammogram for malignant neoplasm of breast: Secondary | ICD-10-CM

## 2015-11-12 ENCOUNTER — Encounter: Payer: Self-pay | Admitting: Internal Medicine

## 2015-11-12 ENCOUNTER — Other Ambulatory Visit: Payer: Self-pay | Admitting: Obstetrics and Gynecology

## 2015-11-12 DIAGNOSIS — N6489 Other specified disorders of breast: Secondary | ICD-10-CM

## 2016-01-27 ENCOUNTER — Ambulatory Visit (AMBULATORY_SURGERY_CENTER): Payer: Self-pay | Admitting: *Deleted

## 2016-01-27 VITALS — Ht 68.25 in | Wt 236.0 lb

## 2016-01-27 DIAGNOSIS — Z1211 Encounter for screening for malignant neoplasm of colon: Secondary | ICD-10-CM

## 2016-01-27 NOTE — Progress Notes (Signed)
No egg or soy allergy known to patient  No issues with past sedation with any surgeries  or procedures, no intubation problems  No diet pills per patient No home 02 use per patient  No blood thinners per patient  Pt states  issues with constipation - takes miralax daily and still has hard stools so will do a 2 day prep  No A fib or A flutter   emmi video to e mail

## 2016-01-28 ENCOUNTER — Ambulatory Visit
Admission: RE | Admit: 2016-01-28 | Discharge: 2016-01-28 | Disposition: A | Discharge status: Home / Self Care | Payer: 59 | Source: Ambulatory Visit | Attending: Obstetrics and Gynecology | Admitting: Obstetrics and Gynecology

## 2016-01-28 ENCOUNTER — Other Ambulatory Visit: Payer: 59

## 2016-01-28 ENCOUNTER — Other Ambulatory Visit: Payer: Self-pay | Admitting: Obstetrics and Gynecology

## 2016-01-28 DIAGNOSIS — N6320 Unspecified lump in the left breast, unspecified quadrant: Secondary | ICD-10-CM

## 2016-01-28 DIAGNOSIS — N6489 Other specified disorders of breast: Secondary | ICD-10-CM

## 2016-02-03 ENCOUNTER — Ambulatory Visit
Admission: RE | Admit: 2016-02-03 | Discharge: 2016-02-03 | Disposition: A | Discharge status: Home / Self Care | Payer: 59 | Source: Ambulatory Visit | Attending: Obstetrics and Gynecology | Admitting: Obstetrics and Gynecology

## 2016-02-03 ENCOUNTER — Other Ambulatory Visit: Payer: Self-pay | Admitting: Obstetrics and Gynecology

## 2016-02-03 DIAGNOSIS — N6489 Other specified disorders of breast: Secondary | ICD-10-CM

## 2016-02-10 ENCOUNTER — Encounter: Payer: 59 | Admitting: Internal Medicine

## 2016-02-23 ENCOUNTER — Ambulatory Visit
Admission: RE | Admit: 2016-02-23 | Discharge: 2016-02-23 | Disposition: A | Discharge status: Home / Self Care | Payer: 59 | Source: Ambulatory Visit | Attending: Obstetrics and Gynecology | Admitting: Obstetrics and Gynecology

## 2016-02-23 ENCOUNTER — Other Ambulatory Visit: Payer: Self-pay | Admitting: Obstetrics and Gynecology

## 2016-02-23 DIAGNOSIS — N6320 Unspecified lump in the left breast, unspecified quadrant: Secondary | ICD-10-CM

## 2016-03-08 ENCOUNTER — Other Ambulatory Visit: Payer: Self-pay | Admitting: Endocrinology

## 2016-03-08 DIAGNOSIS — E049 Nontoxic goiter, unspecified: Secondary | ICD-10-CM

## 2016-03-24 ENCOUNTER — Ambulatory Visit
Admission: RE | Admit: 2016-03-24 | Discharge: 2016-03-24 | Disposition: A | Discharge status: Home / Self Care | Payer: 59 | Source: Ambulatory Visit | Attending: Endocrinology | Admitting: Endocrinology

## 2016-03-24 DIAGNOSIS — E049 Nontoxic goiter, unspecified: Secondary | ICD-10-CM

## 2016-04-13 ENCOUNTER — Ambulatory Visit (AMBULATORY_SURGERY_CENTER): Payer: Self-pay

## 2016-04-13 VITALS — Ht 69.0 in | Wt 236.6 lb

## 2016-04-13 DIAGNOSIS — Z1211 Encounter for screening for malignant neoplasm of colon: Secondary | ICD-10-CM

## 2016-04-13 NOTE — Progress Notes (Signed)
No allergies to eggs or soy No diet meds No home oxygen No past problems with anesthesia  Registered for emmi 

## 2016-04-19 ENCOUNTER — Encounter: Payer: Self-pay | Admitting: Internal Medicine

## 2016-04-27 ENCOUNTER — Encounter: Payer: Self-pay | Admitting: Internal Medicine

## 2016-04-27 ENCOUNTER — Ambulatory Visit (AMBULATORY_SURGERY_CENTER): Payer: 59 | Admitting: Internal Medicine

## 2016-04-27 VITALS — BP 118/65 | HR 65 | Temp 98.7°F | Resp 10 | Ht 69.0 in | Wt 236.0 lb

## 2016-04-27 DIAGNOSIS — Z1211 Encounter for screening for malignant neoplasm of colon: Secondary | ICD-10-CM | POA: Diagnosis not present

## 2016-04-27 DIAGNOSIS — Z1212 Encounter for screening for malignant neoplasm of rectum: Secondary | ICD-10-CM

## 2016-04-27 MED ORDER — SODIUM CHLORIDE 0.9 % IV SOLN: 500.0000 mL | INTRAVENOUS | Status: DC

## 2016-04-27 NOTE — Progress Notes (Signed)
Pt's states no medical or surgical changes since previsit or office visit.  Maw  

## 2016-04-27 NOTE — Op Note (Addendum)
[facility] Patient Name: Maria Terrell Procedure Date: 04/27/2016 10:37 AM MRN: [id number] Endoscopist: Iva Boop , MD Age: 51 Referring MD:  Date of Birth: [birth year] Gender: Female Account #: [id number] Procedure:                Colonoscopy Indications:              Screening for colorectal malignant neoplasm, This                            is the patient's first colonoscopy Medicines:                Propofol per Anesthesia, Monitored Anesthesia Care Procedure:                Pre-Anesthesia Assessment:                           - Prior to the procedure, a History and Physical                            was performed, and patient medications and                            allergies were reviewed. The patient's tolerance of                            previous anesthesia was also reviewed. The risks                            and benefits of the procedure and the sedation                            options and risks were discussed with the patient.                            All questions were answered, and informed consent                            was obtained. Prior Anticoagulants: The patient has                            taken no previous anticoagulant or antiplatelet                            agents. ASA Grade Assessment: II - A patient with                            mild systemic disease. After reviewing the risks                            and benefits, the patient was deemed in                            satisfactory condition to undergo the procedure.  After obtaining informed consent, the colonoscope                            was passed under direct vision. Throughout the                            procedure, the patient's blood pressure, pulse, and                            oxygen saturations were monitored continuously. The                            Colonoscope was introduced through the anus and   advanced to the the cecum, identified by                            appendiceal orifice and ileocecal valve. The                            colonoscopy was performed without difficulty. The                            patient tolerated the procedure well. The bowel                            preparation used was Miralax. The quality of the                            bowel preparation was excellent. The ileocecal                            valve, appendiceal orifice, and rectum were                            photographed. Scope In: 10:40:49 AM Scope Out: 10:57:09 AM Scope Withdrawal Time: 0 hours 13 minutes 33 seconds  Total Procedure Duration: 0 hours 16 minutes 20 seconds  Findings:                 The perianal and digital rectal examinations were                            normal.                           The entire examined colon appeared normal on direct                            and retroflexion views. Complications:            No immediate complications. Estimated Blood Loss:     Estimated blood loss: none. Impression:               - The entire examined colon is normal on direct and  retroflexion views.                           - No specimens collected. Recommendation:           - Patient has a contact number available for                            emergencies. The signs and symptoms of potential                            delayed complications were discussed with the                            patient. Return to normal activities tomorrow.                            Written discharge instructions were provided to the                            patient.                           - Resume previous diet.                           - Continue present medications.                           - Repeat colonoscopy in 10 years for screening                            purposes. Iva Boop, MD 04/27/2016 11:02:06 AM This report has been signed  electronically.

## 2016-04-27 NOTE — Patient Instructions (Addendum)
   Normal colonoscopy!  Next routine colonoscopy in 10 years - 2028  I appreciate the opportunity to care for you. Iva Booparl E. Arnisha Laffoon, MD, [facility]  Discharge instructions given. Normal exam. Resume previous medications. YOU HAD AN ENDOSCOPIC PROCEDURE TODAY AT THE [facility]:   Refer to the procedure report that was given to you for any specific questions about what was found during the examination.  If the procedure report does not answer your questions, please call your gastroenterologist to clarify.  If you requested that your care partner not be given the details of your procedure findings, then the procedure report has been included in a sealed envelope for you to review at your convenience later.  YOU SHOULD EXPECT: Some feelings of bloating in the abdomen. Passage of more gas than usual.  Walking can help get rid of the air that was put into your GI tract during the procedure and reduce the bloating. If you had a lower endoscopy (such as a colonoscopy or flexible sigmoidoscopy) you may notice spotting of blood in your stool or on the toilet paper. If you underwent a bowel prep for your procedure, you may not have a normal bowel movement for a few days.  Please Note:  You might notice some irritation and congestion in your nose or some drainage.  This is from the oxygen used during your procedure.  There is no need for concern and it should clear up in a day or so.  SYMPTOMS TO REPORT IMMEDIATELY:   Following lower endoscopy (colonoscopy or flexible sigmoidoscopy):  Excessive amounts of blood in the stool  Significant tenderness or worsening of abdominal pains  Swelling of the abdomen that is new, acute  Fever of 100F or higher   For urgent or emergent issues, a gastroenterologist can be reached at any hour by calling [phone].   DIET:  We do recommend a small meal at first, but then you may proceed to your regular diet.  Drink plenty of fluids but you should  avoid alcoholic beverages for 24 hours.  ACTIVITY:  You should plan to take it easy for the rest of today and you should NOT DRIVE or use heavy machinery until tomorrow (because of the sedation medicines used during the test).    FOLLOW UP: Our staff will call the number listed on your records the next business day following your procedure to check on you and address any questions or concerns that you may have regarding the information given to you following your procedure. If we do not reach you, we will leave a message.  However, if you are feeling well and you are not experiencing any problems, there is no need to return our call.  We will assume that you have returned to your regular daily activities without incident.  If any biopsies were taken you will be contacted by phone or by letter within the next 1-3 weeks.  Please call us at [phone] if you have not heard about the biopsies in 3 weeks.    SIGNATURES/CONFIDENTIALITY: You and/or your care partner have signed paperwork which will be entered into your electronic medical record.  These signatures attest to the fact that that the information above on your After Visit Summary has been reviewed and is understood.  Full responsibility of the confidentiality of this discharge information lies with you and/or your care-partner.

## 2016-04-27 NOTE — Progress Notes (Signed)
Pt reported her mother had "many colonoscopies and she had a twisty colon".  Pt also has L-5 S-1 back issue and she will need to moved slowly to her left side.  I reported this info to Judithann Saugerarlene Massey, CMA.  maw

## 2016-04-27 NOTE — Progress Notes (Signed)
A and O x3. Report to RN. Tolerated MAC anesthesia well.

## 2016-04-28 ENCOUNTER — Telehealth: Payer: Self-pay

## 2016-04-28 ENCOUNTER — Telehealth: Payer: Self-pay | Admitting: *Deleted

## 2016-04-28 NOTE — Telephone Encounter (Signed)
Left message on answering machine.lbgi

## 2016-04-28 NOTE — Telephone Encounter (Signed)
  Follow up Call-  Call back number 04/27/2016  Post procedure Call Back phone  # #[phone] cell  Permission to leave phone message Yes  Some recent data might be hidden    [facility]

## 2017-02-08 ENCOUNTER — Other Ambulatory Visit: Payer: Self-pay | Admitting: Obstetrics and Gynecology

## 2017-02-08 DIAGNOSIS — Z1231 Encounter for screening mammogram for malignant neoplasm of breast: Secondary | ICD-10-CM

## 2017-03-09 ENCOUNTER — Ambulatory Visit
Admission: RE | Admit: 2017-03-09 | Discharge: 2017-03-09 | Disposition: A | Discharge status: Home / Self Care | Payer: 59 | Source: Ambulatory Visit | Attending: Obstetrics and Gynecology | Admitting: Obstetrics and Gynecology

## 2017-03-09 DIAGNOSIS — Z1231 Encounter for screening mammogram for malignant neoplasm of breast: Secondary | ICD-10-CM

## 2017-03-12 ENCOUNTER — Other Ambulatory Visit: Payer: Self-pay | Admitting: Obstetrics and Gynecology

## 2017-03-12 DIAGNOSIS — R928 Other abnormal and inconclusive findings on diagnostic imaging of breast: Secondary | ICD-10-CM

## 2017-03-23 ENCOUNTER — Ambulatory Visit
Admission: RE | Admit: 2017-03-23 | Discharge: 2017-03-23 | Disposition: A | Discharge status: Home / Self Care | Payer: 59 | Source: Ambulatory Visit | Attending: Obstetrics and Gynecology | Admitting: Obstetrics and Gynecology

## 2017-03-23 DIAGNOSIS — R928 Other abnormal and inconclusive findings on diagnostic imaging of breast: Secondary | ICD-10-CM

## 2017-03-26 ENCOUNTER — Other Ambulatory Visit: Payer: Self-pay | Admitting: Obstetrics and Gynecology

## 2017-03-26 DIAGNOSIS — N6489 Other specified disorders of breast: Secondary | ICD-10-CM

## 2017-03-28 ENCOUNTER — Ambulatory Visit
Admission: RE | Admit: 2017-03-28 | Discharge: 2017-03-28 | Disposition: A | Discharge status: Home / Self Care | Payer: 59 | Source: Ambulatory Visit | Attending: Obstetrics and Gynecology | Admitting: Obstetrics and Gynecology

## 2017-03-28 DIAGNOSIS — N6489 Other specified disorders of breast: Secondary | ICD-10-CM

## 2017-05-09 ENCOUNTER — Encounter: Payer: Self-pay | Admitting: Neurology

## 2017-05-09 ENCOUNTER — Ambulatory Visit: Payer: PRIVATE HEALTH INSURANCE | Admitting: Neurology

## 2017-05-09 VITALS — BP 119/77 | HR 56 | Ht 68.0 in | Wt 238.0 lb

## 2017-05-09 DIAGNOSIS — G9331 Postviral fatigue syndrome: Secondary | ICD-10-CM

## 2017-05-09 DIAGNOSIS — Z8619 Personal history of other infectious and parasitic diseases: Secondary | ICD-10-CM | POA: Diagnosis not present

## 2017-05-09 DIAGNOSIS — G4733 Obstructive sleep apnea (adult) (pediatric): Secondary | ICD-10-CM

## 2017-05-09 DIAGNOSIS — R5382 Chronic fatigue, unspecified: Secondary | ICD-10-CM | POA: Diagnosis not present

## 2017-05-09 DIAGNOSIS — F329 Major depressive disorder, single episode, unspecified: Secondary | ICD-10-CM | POA: Insufficient documentation

## 2017-05-09 DIAGNOSIS — F3289 Other specified depressive episodes: Secondary | ICD-10-CM

## 2017-05-09 DIAGNOSIS — E063 Autoimmune thyroiditis: Secondary | ICD-10-CM

## 2017-05-09 DIAGNOSIS — E038 Other specified hypothyroidism: Secondary | ICD-10-CM

## 2017-05-09 DIAGNOSIS — E66812 Obesity, class 2: Secondary | ICD-10-CM | POA: Insufficient documentation

## 2017-05-09 DIAGNOSIS — G933 Postviral fatigue syndrome: Secondary | ICD-10-CM | POA: Diagnosis not present

## 2017-05-09 DIAGNOSIS — Z9884 Bariatric surgery status: Secondary | ICD-10-CM

## 2017-05-09 DIAGNOSIS — Z6837 Body mass index (BMI) 37.0-37.9, adult: Secondary | ICD-10-CM

## 2017-05-09 DIAGNOSIS — Z9989 Dependence on other enabling machines and devices: Secondary | ICD-10-CM

## 2017-05-09 DIAGNOSIS — F32A Depression, unspecified: Secondary | ICD-10-CM | POA: Insufficient documentation

## 2017-05-09 DIAGNOSIS — G4719 Other hypersomnia: Secondary | ICD-10-CM

## 2017-05-09 NOTE — Progress Notes (Signed)
SLEEP MEDICINE CLINIC   Provider:  Larey Terrell, M D  Primary Care Physician:  Maria Cox, MD   Referring Provider: Rodney Booze, MD , [facility]   Chief Complaint  Patient presents with  . New Patient (Initial Visit)    pt alone, rm 10. pt has had a sleep study before around through [place] in 2012. prior to seeing Dr Maria Terrell in 2012 she was already on a CPAP machine but was ont using it all the time. since seeing Dr Maria Terrell in 2012 she has been consistant with using her CPAP. the CPAP she brings today is the only one she has had and she still uses it reguarly. it is too old to get a reading.      HPI:  Maria Terrell is a 52 y.o. female , seen here in a referral from Dr. Rodney Terrell, Endocrinologist at  [facility] , now I private practice [place] , [place].   I have the pleasure of meeting today for the first time with Maria Terrell, a 52 year old Caucasian married female who is referral to our sleep clinic is based on her endocrinologist.  The referral notes provided by Maria Booze, MD includes a chief diagnosis of Hashimoto's disease.  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  She has undergone laparoscopic partial gastrectomy, is using CPAP for OSA, in the past suffered from depression and osteoarthritis she is doing well on Levoxyl 137 mcg.  The patient was first diagnosed with hypothyroidism about 18 years ago and has a family history of hypothyroidism affecting her mother.  She has worked as a Optician, dispensing and worked in Therapist, art intermittently.  She underwent gastric sleeve surgery 2 years ago and lost 90 pounds, she has been frustrated by her inability by diet and exercise to lose below 200 pounds body weight.  She is taking a multivitamin twice daily calcium and vitamin B12 supplements she does report easily breaking nails, cold intolerance and thinning of her hair.  At one time she took 5000 mcg biotin daily, her periods  have become irregular and she is possibly in perimenopause.  A thyroid ultrasound was also quoted, demonstrating a 0.6 cm hypoechoic nodule in the right medial lobe.  Her highest weight prior to sleeve gastrectomy on 05 January 2015 was 318.9 pounds.  Last labs with her endocrinologist quoted in a visit from 02 April 2017 TSH 0.03, free T4 1.5, free T3 2.9.  Her fatigue however has persisted in spite of the normalization of her thyroid labs.  She is also taking magnesium, Xyzal as needed, microbiologic, occasionally she will need a laxative, she takes Mobic as needed, Zyrtec daily, doxycycline 2 times daily, Trentilex for depression.    Her husband is also a patient in my practice.  The patient had actually seen me in March 2012, 7 years ago, when she was referred at for a sleep study by Maria Boatman, MD. In the meantime she has lost weight, and she has begun using CPAP compliantly- and her husband is using his.  At the 15th March 2012 she was tested for the presence of obstructive sleep apnea and had already been on CPAP.  Her body mass index was 43.7 at the time and her Epworth sleepiness score 16.  Her sleep study documented in apnea-hypercapnia index of 29.3, nearly all respiratory events per hypercapnia.  She did not have prolonged oxygen saturations, during REM sleep her AHI reached 71.6/hr. and in supine sleep 40.4/hr. She has  used CPAP for all this years since our meting  7 years ago, and the machine is now over 70 years old and was originally prescribed by Maria Sewer, MD. She mentioned she was initially not compliant until we met.  Her husband feels the machine is not longer serving her well. He has noted break through snoring there are no therapeutic data available.    Chief complaint according to patient : "I am fatigued, not so  much depressed- and very sleepy- while on CPAP "   Sleep habits are as follows: for the last hour before going to bed she watches TV with husband  Maria Terrell, in the  den- she will need 40 minutes to get ready , showers before bedtime, and falls asleep when she even tries to read. She is asleep promptly by midnight.  The patient's bedroom is described as cool, quiet and dark.  She usually prefers to sleep on her side but once she is asleep she always turns to her back.  She sleeps on one phone contoured pillow for head support, and another pillow between her knees, using a flat mattress with a foam top. She can frequently recall dreaming, not necessarily nightmares. Most nights she does not take a bathroom break. Alarm set at 6 am for medication intake, but she stays in bed until 7 or even 730.  She just does not feel restored and refreshed enough in the morning to start her day easily.  Her sleep is not extremely fragmented and she estimates that she will sleep for well over 6 hours nightly.  Sleep medical history and family sleep history: mother thyroid, everybody is overweight- and many family members have OSA ( 2 of 4 brothers and her father) the others won't get checked.   Social history: married toBryan for 5 years . Lives with husband and two cats. Work hours are 8.30 to 5 PM and Friday to 1 PM.  No tobacco use and no history-  ETOH - less than once a month. Caffeine -coffee in AM 2 cups. And no sodas, occ. iced tea when eating out.  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    Review of Systems:Out of a complete 14 system review, the patient complains of only the following symptoms, and all other reviewed systems are negative.  Epworth score  How likely are you to doze in the following situations: 0 = not likely, 1 = slight chance, 2 = moderate chance, 3 = high chance  Sitting and Reading?  3 Watching Television?  3 Sitting inactive in a public place (theater or meeting)?  1 As a passenger in a car for an hour without a break?  3  Lying down in the afternoon when circumstances permit?  2 Sitting and talking to someone? 0 Sitting quietly after lunch without alcohol? 2 In a car, while stopped for a few minutes in traffic? 1  Total =  15/ 24    , Fatigue severity score 50, depression score  PHQ 9-  9 points - more hypothymia.    Social History   Socioeconomic History  . Marital status: Married    Spouse name: Not on file  . Number of children: Not on file  . Years of education: Not on file  . Highest education level: Not on file  Social Needs  . Financial resource strain: Not on file  . Food insecurity - worry: Not on  file  . Food insecurity - inability: Not on file  . Transportation needs - medical: Not on file  . Transportation needs - non-medical: Not on file  Occupational History  . Not on file  Tobacco Use  . Smoking status: Never Smoker  . Smokeless tobacco: Never Used  Substance and Sexual Activity  . Alcohol use: No  . Drug use: No  . Sexual activity: Not on file  Other Topics Concern  . Not on file  Social History Narrative    married    [place] [employer]   Ft  Work    Neg tad     Family History  Problem Relation Age of Onset  . Heart disease Father        stent  . Sleep apnea Father   . Depression Brother        bipolar  . Hyperlipidemia Unknown   . Thyroid disease Unknown   . Colon polyps Mother   . Colon cancer Neg Hx   . Esophageal cancer Neg Hx   . Rectal cancer Neg Hx   . Stomach cancer Neg Hx     Past Medical History:  Diagnosis Date  . Allergy   . Constipation    after gastric surgery having issues   . Depression   . Endometriosis   . Sleep apnea    wears cpap  . Thyroid goiter     Past Surgical History:  Procedure Laterality Date  . BIOPSY THYROID    . BREAST BIOPSY Left   . BREAST BIOPSY Right   . CHOLECYSTECTOMY  1999  . COLONOSCOPY     15 yrs ago  . LAPAROSCOPIC GASTRIC BAND REMOVAL WITH LAPAROSCOPIC GASTRIC SLEEVE RESECTION  12/2014  . LAPAROTOMY      Current Outpatient Medications   Medication Sig Dispense Refill  . BIOTIN PO Take 5,000 mcg by mouth daily. 2 tablets daily    . cholecalciferol (VITAMIN D) 1000 units tablet Take 1,000 Units by mouth daily.    . clobetasol (TEMOVATE) 0.05 % external solution Apply topically.    . docusate calcium (SURFAK) 240 MG capsule Take 240 mg by mouth daily as needed for mild constipation.    . Doxycycline Hyclate 150 MG TABS Take 1 tablet by mouth daily.    . folic acid (FOLVITE) 675 MCG tablet Take 400 mcg by mouth daily.    . Lactobacillus-Inulin (CULTURELLE DIGESTIVE HEALTH PO) Take 1 capsule by mouth daily.    Marland Kitchen levocetirizine (XYZAL) 5 MG tablet Take 5 mg by mouth every evening.    Marland Kitchen levothyroxine (SYNTHROID, LEVOTHROID) 137 MCG tablet Take 137 mcg by mouth. Take 1 tablet M-Sat, Take 1.5 tablets on Sun    . LEVOXYL 125 MCG tablet     . MAGNESIUM GLUCONATE PO Take 200 mg by mouth.    . meloxicam (MOBIC) 15 MG tablet Take 15 mg by mouth daily as needed.     . metroNIDAZOLE (METROCREAM) 0.75 % cream Apply 1 application topically as needed.     . montelukast (SINGULAIR) 10 MG tablet Take 10 mg by mouth at bedtime.    . multivitamin-iron-minerals-folic acid (THERAPEUTIC-M) TABS tablet Take 1 tablet by mouth daily.    . polyethylene glycol (MIRALAX / GLYCOLAX) packet Take 17 g by mouth daily.    . valACYclovir (VALTREX) 500 MG tablet Take 2 tablets by mouth daily as needed.    . vortioxetine HBr (TRINTELLIX) 20 MG TABS TAKE 1 TABLET BY MOUTH EVERY DAY  No current facility-administered medications for this visit.     Allergies as of 05/09/2017 - Review Complete 05/09/2017  Allergen Reaction Noted  . Cefaclor Other (See Comments) 11/19/2013    Vitals: BP 119/77   Pulse (!) 56   Ht _0  (1.727 m)   Wt 238 lb (108 kg)   BMI 36.19 kg/m  Last Weight:  Wt Readings from Last 1 Encounters:  05/09/17 238 lb (108 kg)   HCW:CBJS mass index is 36.19 kg/m.     Last Height:   Ht Readings from Last 1 Encounters:  05/09/17 _1   (1.727 m)    Physical exam:  General: The patient is awake, alert and appears not in acute distress. The patient is well groomed. Head: Normocephalic, atraumatic. Neck is supple. Mallampati 2  neck circumference:14 ( was 16.5 7 ears ago ). Nasal airflow patent. Mild  Retrognathia. Permanent retainers in place. crossbite Cardiovascular:  Regular rate and rhythm, without  murmurs or carotid bruit, and without distended neck veins. Respiratory: Lungs are clear to auscultation. Skin:  Without evidence of edema, or rash Trunk: BMI is 36.2  . The patient's posture is erect  Neurologic exam : The patient is awake and alert, oriented to place and time.   Memory subjective described as intact.  Attention span & concentration ability appears normal.  Speech is fluent,  without dysarthria, dysphonia or aphasia.  Mood and affect are appropriate.  Cranial nerves: Pupils are equal and briskly reactive to light. Funduscopic exam without evidence of pallor or edema. Extraocular movements  in vertical and horizontal planes intact and without nystagmus. Visual fields by finger perimetry are intact. Hearing to finger rub intact. Facial sensation intact to fine touch.  Facial motor strength is symmetric and tongue and uvula move midline. Shoulder shrug was symmetrical. Shoulder appears droopy. Body is Pear shaped.    Motor exam:   Normal tone, muscle bulk and symmetric strength in all extremities. Sensory:  Fine touch, pinprick and vibration /Proprioception tested in the upper extremities was normal. She feels her fingers are often cold.  Coordination: Rapid alternating movements/. Finger-to-nose maneuver normal without evidence of ataxia, dysmetria or tremor. Gait and station: Patient walks without assistive device .Tandem gait is unfragmented. Turns with 3 Steps.  Deep tendon reflexes: in the  upper and lower extremities are symmetric and intact. Babinski maneuver response is  downgoing.  Assessment:   After physical and neurologic examination, review of laboratory studies,  Personal review of imaging studies, reports of other /same  Imaging studies, results of polysomnography and / or neurophysiology testing and pre-existing records as far as provided in visit., my assessment is   1) morbid obesity, BMI 37  Maria Terrell has lost a lot of weight and has adhered to dietary regimen, supplements, and exercise. It has improved her fatigue but not alleviated. She had a gastric sleeve procedure - lost by now 80 pounds.   2) OSA on CPAP - her apnea was moderate - but highly accentuated during REM and supine sleep. I am not optimistic that a dental device will work for her, but her OSA- AHI  may be much lower now.   3) Chronic fatigue. Muscle pain, myalgia, arthralgia. Thyroid condition seems well controlled. Trains for  cardio and weight lifting 2-3 weekly. Stretching exercises. Gets massages.    The patient was advised of the nature of the diagnosed disorder , the treatment options and the  risks for general health and wellness arising from not treating the  condition.   I spent more than 50 minutes of face to face time with the patient.  Greater than 50% of time was spent in counseling and coordination of care. We have discussed the diagnosis and differential and I answered the patient's questions.    Plan:  Treatment plan and additional workup :  continue work out, We will need to reevaluate this patient if sleep apnea is still present.  She is covered by is [place] plan, which usually promote home sleep test.  I will order a home sleep test for this patient to confirm if apnea is still present, however the degree of apnea and REM sleep usually determines if CPAP or a dental device can be used.  Given her weight loss she may no longer have the REM accentuated component she presented with before.  For this reason I will request that the patient be placed on a watch Pat.  I would also like to add that in  spite of well-controlled Hashimoto's thyroiditis, weight loss, taking regular supplements, and improvement in her depression condition her Epworth sleepiness score is similar to the one 7 years ago. My diagnosis is persistent hypersomnia, morbid obesity but now only class I down from class III 7 years ago, hypothyroidism, hypothymia.    Maria Seat, MD 9/91/4445, 8:48 AM  Certified in Neurology by ABPN Certified in [place] by [facility] [address], [place], [place] [address]

## 2017-05-09 NOTE — Patient Instructions (Signed)

## 2017-05-28 ENCOUNTER — Ambulatory Visit (INDEPENDENT_AMBULATORY_CARE_PROVIDER_SITE_OTHER): Payer: PRIVATE HEALTH INSURANCE | Admitting: Neurology

## 2017-05-28 DIAGNOSIS — G4733 Obstructive sleep apnea (adult) (pediatric): Secondary | ICD-10-CM | POA: Diagnosis not present

## 2017-05-28 DIAGNOSIS — G4710 Hypersomnia, unspecified: Secondary | ICD-10-CM

## 2017-05-28 DIAGNOSIS — G4719 Other hypersomnia: Secondary | ICD-10-CM

## 2017-05-28 DIAGNOSIS — R5381 Other malaise: Secondary | ICD-10-CM

## 2017-05-28 DIAGNOSIS — Z9989 Dependence on other enabling machines and devices: Secondary | ICD-10-CM

## 2017-05-28 DIAGNOSIS — R5382 Chronic fatigue, unspecified: Secondary | ICD-10-CM

## 2017-05-28 DIAGNOSIS — Z9884 Bariatric surgery status: Secondary | ICD-10-CM

## 2017-06-01 NOTE — Procedures (Signed)
[facility] [address] [place], [place] [address] NAME:  Maria PhillipsSusan K. Mohler                                                   DOB: 04-Aug-1965 MEDICAL RECORD [id number]                                           DOS: 05/28/2017 REFERRING PHYSICIAN:  Rossie MuskratBrittany Henderson, MD   STUDY PERFORMED: Home Sleep Study by Watch Dennie BiblePat  HISTORY:  Carney LivingSusan K Roemmich is a 52 y.o. female, seen here in a referral from Dr. Rossie MuskratBrittany Henderson, while she was the patient's Endocrinologist at [facility], now in private practice in [place], [place]. The referral notes provided by Rossie MuskratBrittany Henderson, MD includes a chief diagnosis of Hashimoto's disease.  She has hypothyroidism secondary to this autoimmune condition.  She has undergone laparoscopic partial gastrectomy, is obese, is using already a CPAP for OSA therapy, and carries a diagnosis of depression and osteoarthritis.  BMI: 36.1. The Epworth Sleepiness Score was endorsed at 15/ 24 points, and the FSS at 50/63 points.   STUDY RESULTS:  Total Recording Time: 7 hours, 43 minutes, TST of 6 hrs, and 40 minutes. Total Apnea/Hypopnea Index (AHI):  5.4 /h; RDI was 14.3 /h Average Oxygen Saturation: 95 %; Lowest Oxygen Desaturation: 92 %  Total Time in Oxygen Saturation below 89% was 0.0 minutes  Average Heart Rate:  62 bpm (between 46 and 87 bpm).   IMPRESSION: Very mild apnea with moderate snoring, no evidence of hypoxemia, and normal heart rate variation in NSR.  RECOMMENDATION: There is no evidence of OSA being severe enough to cause the degree of hypersomnia and fatigue. Confirmed by history, since she already used CPAP with high compliance and remained persistently fatigued and sleepy. She snores, may have UARS. To continue CPAP is her option.  Secondary causes of hypersomnia need to be evaluated, including narcolepsy. The patient will be scheduled for a PSG followed by MSLT.    I certify that I have reviewed the raw data recording  prior to the issuance of this report in accordance with the standards of the American Academy of Sleep Medicine (AASM).  Melvyn Novasarmen Farryn Linares, M.D.  05-31-2017 Medical Director of [facility], accredited by the AASM. Diplomat of the ABPN and ABSM.

## 2017-06-01 NOTE — Addendum Note (Signed)
Addended by: Melvyn NovasHMEIER, Yani Lal on: 06/01/2017 11:15 AM   Modules accepted: Orders

## 2017-06-04 ENCOUNTER — Telehealth: Payer: Self-pay | Admitting: Neurology

## 2017-06-04 NOTE — Telephone Encounter (Signed)
Called patient to discuss sleep study results. No answer at this time. LVM for the patient to call back.   

## 2017-06-04 NOTE — Telephone Encounter (Signed)
-----   Message from Melvyn Novasarmen Dohmeier, MD sent at 06/01/2017 11:15 AM EDT ----- IMPRESSION: Very mild apnea with moderate snoring, no evidence of  hypoxemia, and normal heart rate variation in NSR.  RECOMMENDATION: There is no evidence of OSA being severe enough  to cause the degree of hypersomnia and fatigue. Confirmed by  history, since she already used CPAP with high compliance and  remained persistently fatigued and sleepy. She snores, may have  UARS. To continue CPAP is her option.  Secondary causes of hypersomnia need to be evaluated, including  narcolepsy. The patient will be scheduled for a PSG followed by  MSLT.   PS : Dr SUN=The patient would need to wean off all  antidepressants in order to have a valid MSLT.  Can she be (safely) off Trentillex for 14 days or longer ? May be during Summer vacation time?    CC Dr. Orson AloeHenderson, [facility]

## 2017-06-07 NOTE — Telephone Encounter (Signed)
Made 2nd attempt to call the pt to review sleep study results. No answer, LVM for the pt

## 2017-06-11 ENCOUNTER — Telehealth: Payer: Self-pay

## 2017-06-11 NOTE — Telephone Encounter (Signed)
Called pt to schedule PSG/MSLT study.  Explained results of HST as well.  Pt does not want to schedule sleep study yet.  Pt is having issues with Thyroid too.  She is wanting to get it checked out first.  Pt stated she will continue with her CPAP therapy for now. Once she gets results from Thyroid testing, she will then follow up with study if need be.

## 2017-06-18 ENCOUNTER — Other Ambulatory Visit: Payer: Self-pay

## 2017-06-18 ENCOUNTER — Encounter (HOSPITAL_BASED_OUTPATIENT_CLINIC_OR_DEPARTMENT_OTHER): Payer: Self-pay | Admitting: Emergency Medicine

## 2017-06-18 ENCOUNTER — Emergency Department (HOSPITAL_BASED_OUTPATIENT_CLINIC_OR_DEPARTMENT_OTHER)
Admission: EM | Admit: 2017-06-18 | Discharge: 2017-06-18 | Disposition: A | Discharge status: Home / Self Care | Payer: PRIVATE HEALTH INSURANCE | Attending: Emergency Medicine | Admitting: Emergency Medicine

## 2017-06-18 ENCOUNTER — Emergency Department (HOSPITAL_BASED_OUTPATIENT_CLINIC_OR_DEPARTMENT_OTHER): Payer: PRIVATE HEALTH INSURANCE

## 2017-06-18 DIAGNOSIS — Y9389 Activity, other specified: Secondary | ICD-10-CM | POA: Diagnosis not present

## 2017-06-18 DIAGNOSIS — Y998 Other external cause status: Secondary | ICD-10-CM | POA: Insufficient documentation

## 2017-06-18 DIAGNOSIS — S61213A Laceration without foreign body of left middle finger without damage to nail, initial encounter: Secondary | ICD-10-CM | POA: Insufficient documentation

## 2017-06-18 DIAGNOSIS — Z79899 Other long term (current) drug therapy: Secondary | ICD-10-CM | POA: Insufficient documentation

## 2017-06-18 DIAGNOSIS — E039 Hypothyroidism, unspecified: Secondary | ICD-10-CM | POA: Insufficient documentation

## 2017-06-18 DIAGNOSIS — S61215A Laceration without foreign body of left ring finger without damage to nail, initial encounter: Secondary | ICD-10-CM | POA: Diagnosis not present

## 2017-06-18 DIAGNOSIS — S61311A Laceration without foreign body of left index finger with damage to nail, initial encounter: Secondary | ICD-10-CM | POA: Diagnosis not present

## 2017-06-18 DIAGNOSIS — Y929 Unspecified place or not applicable: Secondary | ICD-10-CM | POA: Insufficient documentation

## 2017-06-18 DIAGNOSIS — W293XXA Contact with powered garden and outdoor hand tools and machinery, initial encounter: Secondary | ICD-10-CM | POA: Insufficient documentation

## 2017-06-18 DIAGNOSIS — Z23 Encounter for immunization: Secondary | ICD-10-CM | POA: Insufficient documentation

## 2017-06-18 DIAGNOSIS — S6992XA Unspecified injury of left wrist, hand and finger(s), initial encounter: Secondary | ICD-10-CM | POA: Diagnosis present

## 2017-06-18 DIAGNOSIS — S62639B Displaced fracture of distal phalanx of unspecified finger, initial encounter for open fracture: Secondary | ICD-10-CM | POA: Insufficient documentation

## 2017-06-18 MED ORDER — DOXYCYCLINE HYCLATE 100 MG PO TABS
100.0000 mg | ORAL_TABLET | Freq: Once | ORAL | Status: AC
  Administered 2017-06-18: 100 mg via ORAL
  Filled 2017-06-18: qty 1

## 2017-06-18 MED ORDER — TETANUS-DIPHTH-ACELL PERTUSSIS 5-2.5-18.5 LF-MCG/0.5 IM SUSP
0.5000 mL | Freq: Once | INTRAMUSCULAR | Status: AC
  Administered 2017-06-18: 0.5 mL via INTRAMUSCULAR
  Filled 2017-06-18: qty 0.5

## 2017-06-18 MED ORDER — DOXYCYCLINE HYCLATE 100 MG PO CAPS: 100.0000 mg | ORAL_CAPSULE | Freq: Two times a day (BID) | ORAL | 0 refills | Status: AC

## 2017-06-18 NOTE — Discharge Instructions (Addendum)
You may remove the bandage after 24 hours. Clean the wound and surrounding area gently with tap water and mild soap. Rinse well and blot dry. Do not scrub the wound, as this may cause the wound edges to come apart. You may shower, but avoid submerging the wound, such as with a bath or swimming. Clean the wound daily to prevent infection. Do not use cleaners such as hydrogen peroxide or alcohol.   Please take all of your antibiotics until finished!   You may develop abdominal discomfort or diarrhea from the antibiotic.  You may help offset this with probiotics which you can buy or get in yogurt. Do not eat or take the probiotics until 2 hours after your antibiotic.   Scar reduction: Application of a topical antibiotic ointment, such as Neosporin, after the wound has begun to close and heal well can decrease scab formation and reduce scarring. After the wound has healed and wound closures have been removed, application of ointments such as Aquaphor can also reduce scar formation.  The key to scar reduction is keeping the skin well hydrated and supple. Drinking plenty of water throughout the day (At least eight 8oz glasses of water a day) is essential to staying well hydrated.  Sun exposure: Keep the wound out of the sun. After the wound has healed, continue to protect it from the sun by wearing protective clothing or applying sunscreen.  Pain: You may use Tylenol, naproxen, or ibuprofen for pain.  Follow-up: Follow-up with the hand specialist as soon as possible.  Call the number provided to set up an appointment.  Suture/staple removal: Suture removal typically in 12-14 days, however, this will be at the discretion of the hand specialist.  Return to the ED sooner should the wound edges come apart or signs of infection arise, such as spreading redness, puffiness/swelling, pus draining from the wound, severe increase in pain, fever over 100.38F, or any other major issues.

## 2017-06-18 NOTE — ED Provider Notes (Signed)
[facility] EMERGENCY DEPARTMENT Provider Note   CSN: [id number] Arrival date & time: 06/18/17  1552     History   Chief Complaint Chief Complaint  Patient presents with  . Extremity Laceration    HPI Maria Terrell is a 52 y.o. female.  HPI   Maria Terrell is a 52 y.o. female, presenting to the ED with multiple lacerations to the fingers of the left hand that occurred shortly prior to arrival.  Patient was cut with electric hedge trimmer.  Tetanus not up-to-date.  Denies numbness, weakness, other injuries, or any other complaints.       Past Medical History:  Diagnosis Date  . Allergy   . Constipation    after gastric surgery having issues   . Depression   . Endometriosis   . Sleep apnea    wears cpap  . Thyroid goiter     Patient Active Problem List   Diagnosis Date Noted  . History of mononucleosis 05/09/2017  . Depression 05/09/2017  . OSA on CPAP 05/09/2017  . Chronic fatigue 05/09/2017  . Hypothyroidism due to Hashimoto's thyroiditis 05/09/2017  . Class 2 severe obesity with serious comorbidity and body mass index (BMI) of 37.0 to 37.9 in adult (HCC) 05/09/2017  . Pain in joint, ankle and foot 07/10/2013  . Excessive sleepiness 07/03/2012  . Goiter 07/03/2012  . Visit for preventive health examination 07/03/2012  . Plantar fasciitis of right foot 04/16/2012  . Foreign travel 04/09/2012  . OTHER SPECIFIED DISORDER OF SKIN 01/28/2010  . CARPAL TUNNEL SYNDROME, RIGHT 10/22/2009  . IRON DEFICIENCY 07/28/2009  . DIARRHEA 12/31/2008  . OTHER SPECIFIED DISORDER OF RECTUM AND ANUS 07/24/2008  . PRURITUS, ANAL 07/24/2008  . SHOULDER PAIN, LEFT 01/24/2008  . OBESITY 04/19/2007  . LOW BACK PAIN, CHRONIC 03/13/2007  . OBSTRUCTIVE SLEEP APNEA 02/26/2007  . FATIGUE 01/04/2007  . SYMPTOM, ENLARGEMENT, LYMPH NODES 12/20/2006  . INTERTRIGO, CANDIDAL 11/16/2006  . ABNORMAL RESULT, FUNCTION STUDY, THYROID 11/16/2006  . DEPRESSION 09/06/2006  .  ALLERGIC RHINITIS 09/06/2006  . ENDOMETRIOSIS NOS 09/06/2006    Past Surgical History:  Procedure Laterality Date  . BIOPSY THYROID    . BREAST BIOPSY Left   . BREAST BIOPSY Right   . CHOLECYSTECTOMY  1999  . COLONOSCOPY     15 yrs ago  . LAPAROSCOPIC GASTRIC BAND REMOVAL WITH LAPAROSCOPIC GASTRIC SLEEVE RESECTION  12/2014  . LAPAROTOMY       OB History   None      Home Medications    Prior to Admission medications   Medication Sig Start Date End Date Taking? Authorizing Provider  BIOTIN PO Take 5,000 mcg by mouth daily. 2 tablets daily   Yes [provider]  clobetasol (TEMOVATE) 0.05 % external solution Apply topically.   Yes [provider]  levocetirizine (XYZAL) 5 MG tablet Take 5 mg by mouth every evening.   Yes [provider]  MAGNESIUM GLUCONATE PO Take 200 mg by mouth.   Yes [provider]  multivitamin-iron-minerals-folic acid (THERAPEUTIC-M) TABS tablet Take 1 tablet by mouth daily.   Yes [provider]  vortioxetine HBr (TRINTELLIX) 20 MG TABS TAKE 1 TABLET BY MOUTH EVERY DAY 06/18/15  Yes [provider]  cholecalciferol (VITAMIN D) 1000 units tablet Take 1,000 Units by mouth daily.    [provider]  docusate calcium (SURFAK) 240 MG capsule Take 240 mg by mouth daily as needed for mild constipation.    [provider]  doxycycline (VIBRAMYCIN) 100 MG capsule Take 1 capsule (100 mg total) by mouth 2 (two) times daily for 5 days. 06/18/17 06/23/17  Joy, Shawn C, PA-C  Doxycycline Hyclate 150 MG TABS Take 1 tablet by mouth daily.    [provider]  folic acid (FOLVITE) 400 MCG tablet Take 400 mcg by mouth daily.    [provider]  Lactobacillus-Inulin (CULTURELLE DIGESTIVE HEALTH PO) Take 1 capsule by mouth daily.    [provider]  levothyroxine (SYNTHROID, LEVOTHROID) 137 MCG tablet Take 137 mcg by mouth. Take 1 tablet M-Sat, Take 1.5 tablets on Sun    [provider]  LEVOXYL 125 MCG tablet  05/07/17   [provider]  meloxicam (MOBIC) 15 MG tablet Take 15 mg by mouth daily as needed.     [provider]  metroNIDAZOLE (METROCREAM) 0.75 % cream Apply 1 application topically as needed.     [provider]  montelukast (SINGULAIR) 10 MG tablet Take 10 mg by mouth at bedtime.    [provider]  polyethylene glycol (MIRALAX / GLYCOLAX) packet Take 17 g by mouth daily.    [provider]  valACYclovir (VALTREX) 500 MG tablet Take 2 tablets by mouth daily as needed.    [provider]    Family History Family History  Problem Relation Age of Onset  . Heart disease Father        stent  . Sleep apnea Father   . Depression Brother        bipolar  . Hyperlipidemia Unknown   . Thyroid disease Unknown   . Colon polyps Mother   . Colon cancer Neg Hx   . Esophageal cancer Neg Hx   . Rectal cancer Neg Hx   . Stomach cancer Neg Hx     Social History Social History   Tobacco Use  . Smoking status: Never Smoker  . Smokeless tobacco: Never Used  Substance Use Topics  . Alcohol use: No  . Drug use: No     Allergies   Cefaclor   Review of Systems Review of Systems  Skin: Positive for wound.  Neurological: Negative for weakness and numbness.     Physical Exam Updated Vital Signs BP (!) 147/69 (BP Location: Right Arm)   Pulse 72   Temp 98.8 F (37.1 C) (Oral)   Resp 20   Ht 5\' 8"  (1.727 m)   Wt 108.9 kg (240 lb)   LMP 06/18/2017 (Exact Date)   SpO2 100%   BMI 36.49 kg/m   Physical Exam  Constitutional: She appears well-developed and well-nourished. No distress.  HENT:  Head: Normocephalic and atraumatic.  Eyes: Conjunctivae are normal.  Neck: Neck supple.  Cardiovascular: Normal rate and regular rhythm.  Pulmonary/Chest: Effort normal.  Musculoskeletal:  Flexion and extension intact at the MCP, PIP, and DIP joints of the fingers of the left hand.  Neurological:  She is alert.  Some decreased sensation to light touch to the distal, radial aspect of the left middle finger. No noted sensory deficits to the remainder of the fingers of the left hand. Flexion and extension of the DIP, PIP, and MCP joints of the fingers of the left hand intact against resistance.  Skin: Skin is warm and dry. Capillary refill takes less than 2 seconds. She is not diaphoretic. No pallor.  Left index finger lacerations, distal to proximal: Laceration 1: 1 cm, obliquely through the ulnar edge of the patient's nail and nailbed, creating partial avulsion. Laceration  2: 2 cm, involving volar, radial, and dorsal surfaces, involves nail and nail bed.  Laceration 3: 1.5 cm, volar surface  Left middle finger lacerations, distal to proximal: 3 cm laceration to the volar surface, extending to the radial surface of the distal finger. 2.5 cm Y-shaped laceration to the volar surface extending to the radial surface.  Left ring finger: 1.5 cm laceration to the distal volar surface.  Psychiatric: She has a normal mood and affect. Her behavior is normal.  Nursing note and vitals reviewed.                               ED Treatments / Results  Labs (all labs ordered are listed, but only abnormal results are displayed) Labs Reviewed - No data to display  EKG None  Radiology Dg Hand Complete Left  Result Date: 06/18/2017 CLINICAL DATA:  Lacerations to the distal second, third, and fourth fingers after hedge trimmer injury. EXAM: LEFT HAND - COMPLETE 3+ VIEW COMPARISON:  None. FINDINGS: Nondisplaced fracture through the distal tuft of the index finger. Comminuted fracture along the volar aspect of the middle finger distal phalanx. Minimal degenerative changes of the IP joints. Joint spaces are preserved. Bone mineralization is normal. Soft tissue lacerations at the volar tips of the index, middle, and ring fingers. IMPRESSION: 1. Nondisplaced fracture of the  index finger distal tuft. 2. Comminuted fracture of the volar middle finger distal phalanx. Electronically Signed   By: Obie Dredge M.D.   On: 06/18/2017 19:17    Procedures .Nerve Block Date/Time: 06/18/2017 4:42 PM Performed by: Anselm Pancoast, PA-C Authorized by: Anselm Pancoast, PA-C   Consent:    Consent obtained:  Verbal   Consent given by:  Patient   Risks discussed:  Swelling, unsuccessful block and pain Indications:    Indications:  Procedural anesthesia and pain relief Location:    Body area:  Upper extremity   Upper extremity nerve blocked: Digital, left index finger.   Laterality:  Left Pre-procedure details:    Skin preparation:  Alcohol Procedure details (see MAR for exact dosages):    Block needle gauge:  25 G   Anesthetic injected:  Bupivacaine 0.5% w/o epi   Injection procedure:  Anatomic landmarks identified, anatomic landmarks palpated, incremental injection, introduced needle and negative aspiration for blood Post-procedure details:    Outcome:  Anesthesia achieved   Patient tolerance of procedure:  Tolerated well, no immediate complications .Nerve Block Date/Time: 06/18/2017 4:44 PM Performed by: Anselm Pancoast, PA-C Authorized by: Anselm Pancoast, PA-C   Consent:    Consent obtained:  Verbal   Consent given by:  Patient   Risks discussed:  Pain, swelling and unsuccessful block Indications:    Indications:  Pain relief and procedural anesthesia Location:    Body area:  Upper extremity   Upper extremity nerve blocked: Digital, left middle finger.   Laterality:  Left Pre-procedure details:    Skin preparation:  Alcohol Procedure details (see MAR for exact dosages):    Block needle gauge:  25 G   Anesthetic injected:  Bupivacaine 0.5% w/o epi   Injection procedure:  Anatomic landmarks identified, anatomic landmarks palpated, incremental injection, introduced needle and negative aspiration for blood Post-procedure details:    Outcome:  Anesthesia achieved    Patient tolerance of procedure:  Tolerated well, no immediate complications .Nerve Block Date/Time: 06/18/2017 4:47 PM Performed by: Anselm Pancoast, PA-C Authorized by: Harolyn Rutherford  C, PA-C   Consent:    Consent obtained:  Verbal   Consent given by:  Patient   Risks discussed:  Pain, unsuccessful block and swelling Indications:    Indications:  Pain relief and procedural anesthesia Location:    Body area:  Upper extremity   Upper extremity nerve blocked: Digital, left ring finger.   Laterality:  Left Pre-procedure details:    Skin preparation:  Alcohol Procedure details (see MAR for exact dosages):    Block needle gauge:  25 G   Anesthetic injected:  Bupivacaine 0.5% w/o epi   Injection procedure:  Anatomic landmarks identified, anatomic landmarks palpated, incremental injection, introduced needle and negative aspiration for blood Post-procedure details:    Outcome:  Anesthesia achieved   Patient tolerance of procedure:  Tolerated well, no immediate complications .Nail Removal Date/Time: 06/18/2017 8:30 PM Performed by: Anselm Pancoast, PA-C Authorized by: Anselm Pancoast, PA-C   Consent:    Consent obtained:  Verbal   Consent given by:  Patient   Risks discussed:  Incomplete removal, infection, pain and permanent nail deformity Location:    Hand:  L index finger Pre-procedure details:    Skin preparation:  Betadine   Preparation: Patient was prepped and draped in the usual sterile fashion   Anesthesia (see MAR for exact dosages):    Anesthesia method:  Nerve block   Block location:  Digital   Block needle gauge:  25 G   Block anesthetic:  Bupivacaine 0.5% w/o epi   Block injection procedure:  Anatomic landmarks identified, anatomic landmarks palpated, introduced needle, negative aspiration for blood and incremental injection   Block outcome:  Anesthesia achieved Nail Removal:    Nail removed:  Partial   Nail side:  Ulnar   Nail bed repaired: yes     Nail bed suture material:  Repaired in continuity with the rest of the laceration using 5-0 nylon.   Number of sutures:  5   Removed nail replaced and anchored: no   Ingrown nail:    Nail matrix removed or ablated:  None Post-procedure details:    Dressing:  Splint and 4x4 sterile gauze   Patient tolerance of procedure:  Tolerated well, no immediate complications .Marland KitchenLaceration Repair Date/Time: 06/18/2017 8:15 PM Performed by: Anselm Pancoast, PA-C Authorized by: Anselm Pancoast, PA-C   Consent:    Consent obtained:  Verbal   Consent given by:  Patient   Risks discussed:  Infection, pain, poor cosmetic result, poor wound healing and need for additional repair Anesthesia (see MAR for exact dosages):    Anesthesia method:  Nerve block   Block location:  Digital   Block needle gauge:  25 G   Block anesthetic:  Bupivacaine 0.5% w/o epi   Block injection procedure:  Anatomic landmarks identified, anatomic landmarks palpated, introduced needle, negative aspiration for blood and incremental injection   Block outcome:  Anesthesia achieved Laceration details:    Location:  Finger   Finger location:  L ring finger   Length (cm):  1.5 Repair type:    Repair type:  Simple Pre-procedure details:    Preparation:  Patient was prepped and draped in usual sterile fashion and imaging obtained to evaluate for foreign bodies Exploration:    Hemostasis achieved with:  Tourniquet   Wound exploration: wound explored through full range of motion   Treatment:    Area cleansed with:  Saline and Betadine   Amount of cleaning:  Extensive   Irrigation solution:  Sterile saline  Irrigation method:  Tap Skin repair:    Repair method:  Sutures   Suture size:  5-0   Suture material:  Nylon   Number of sutures:  5 Approximation:    Approximation:  Close Post-procedure details:    Dressing:  Antibiotic ointment, non-adherent dressing and splint for protection   Patient tolerance of procedure:  Tolerated well, no immediate  complications .Marland KitchenLaceration Repair Date/Time: 06/18/2017 8:30 PM Performed by: Anselm Pancoast, PA-C Authorized by: Anselm Pancoast, PA-C   Consent:    Consent obtained:  Verbal   Consent given by:  Patient   Risks discussed:  Infection, need for additional repair, pain, poor cosmetic result and poor wound healing Anesthesia (see MAR for exact dosages):    Anesthesia method:  Nerve block   Block location:  Digital   Block needle gauge:  25 G   Block anesthetic:  Bupivacaine 0.5% w/o epi   Block injection procedure:  Anatomic landmarks identified, anatomic landmarks palpated, introduced needle, negative aspiration for blood and incremental injection   Block outcome:  Anesthesia achieved Laceration details:    Location:  Finger   Finger location:  L index finger   Length (cm):  1 Repair type:    Repair type:  Simple Pre-procedure details:    Preparation:  Patient was prepped and draped in usual sterile fashion and imaging obtained to evaluate for foreign bodies Exploration:    Hemostasis achieved with:  Tourniquet   Wound exploration: wound explored through full range of motion   Treatment:    Area cleansed with:  Betadine   Amount of cleaning:  Extensive   Irrigation solution:  Tap water   Irrigation method:  Tap Skin repair:    Repair method:  Sutures   Suture size:  5-0   Suture material:  Nylon   Suture technique:  Simple interrupted   Number of sutures:  3 Approximation:    Approximation:  Close Post-procedure details:    Dressing:  Splint for protection, antibiotic ointment and sterile dressing   Patient tolerance of procedure:  Tolerated well, no immediate complications .Marland KitchenLaceration Repair Date/Time: 06/18/2017 8:45 PM Performed by: Anselm Pancoast, PA-C Authorized by: Anselm Pancoast, PA-C   Consent:    Consent obtained:  Verbal   Consent given by:  Patient   Risks discussed:  Infection, need for additional repair, poor cosmetic result, poor wound healing and  pain Anesthesia (see MAR for exact dosages):    Anesthesia method:  Nerve block   Block location:  Digital   Block needle gauge:  25 G   Block anesthetic:  Bupivacaine 0.5% w/o epi   Block injection procedure:  Anatomic landmarks identified, anatomic landmarks palpated, negative aspiration for blood, introduced needle and incremental injection   Block outcome:  Anesthesia achieved Laceration details:    Location:  Finger   Finger location:  L index finger   Length (cm):  2 Repair type:    Repair type:  Intermediate Pre-procedure details:    Preparation:  Patient was prepped and draped in usual sterile fashion and imaging obtained to evaluate for foreign bodies Exploration:    Hemostasis achieved with:  Tourniquet   Wound exploration: wound explored through full range of motion   Treatment:    Area cleansed with:  Betadine   Amount of cleaning:  Extensive   Irrigation solution:  Tap water   Irrigation method:  Tap Skin repair:    Repair method:  Sutures   Suture size:  5-0  Suture material:  Nylon   Suture technique:  Simple interrupted   Number of sutures:  11 Approximation:    Approximation:  Close Post-procedure details:    Dressing:  Antibiotic ointment, sterile dressing and splint for protection   Patient tolerance of procedure:  Tolerated well, no immediate complications .Marland KitchenLaceration Repair Date/Time: 06/19/2017 5:31 PM Performed by: Anselm Pancoast, PA-C Authorized by: Anselm Pancoast, PA-C   Consent:    Consent obtained:  Verbal   Consent given by:  Patient   Risks discussed:  Need for additional repair, infection, poor cosmetic result, poor wound healing and pain Anesthesia (see MAR for exact dosages):    Anesthesia method:  Nerve block   Block location:  Digital   Block needle gauge:  25 G   Block anesthetic:  Bupivacaine 0.5% w/o epi   Block injection procedure:  Anatomic landmarks identified, anatomic landmarks palpated, introduced needle, negative aspiration for  blood and incremental injection   Block outcome:  Anesthesia achieved Laceration details:    Location:  Finger   Finger location:  L index finger   Length (cm):  1.5   Depth (mm):  5 Repair type:    Repair type:  Intermediate Pre-procedure details:    Preparation:  Patient was prepped and draped in usual sterile fashion and imaging obtained to evaluate for foreign bodies Exploration:    Hemostasis achieved with:  Tourniquet   Wound exploration: wound explored through full range of motion   Treatment:    Area cleansed with:  Betadine   Amount of cleaning:  Extensive   Irrigation solution:  Tap water   Irrigation method:  Tap Skin repair:    Repair method:  Sutures   Suture size:  5-0   Suture material:  Nylon   Suture technique:  Simple interrupted   Number of sutures:  5 Approximation:    Approximation:  Close Post-procedure details:    Dressing:  Sterile dressing, splint for protection and antibiotic ointment   Patient tolerance of procedure:  Tolerated well, no immediate complications .Marland KitchenLaceration Repair Date/Time: 06/18/2017 9:00 PM Performed by: Anselm Pancoast, PA-C Authorized by: Anselm Pancoast, PA-C   Consent:    Consent obtained:  Verbal   Consent given by:  Patient   Risks discussed:  Infection, need for additional repair, poor cosmetic result, poor wound healing, pain and nerve damage Anesthesia (see MAR for exact dosages):    Anesthesia method:  Nerve block   Block location:  Digital   Block needle gauge:  25 G   Block anesthetic:  Bupivacaine 0.5% w/o epi   Block injection procedure:  Anatomic landmarks identified, anatomic landmarks palpated, introduced needle, negative aspiration for blood and incremental injection   Block outcome:  Anesthesia achieved Laceration details:    Location:  Finger   Finger location:  L long finger   Length (cm):  3 Repair type:    Repair type:  Complex Pre-procedure details:    Preparation:  Patient was prepped and draped in  usual sterile fashion and imaging obtained to evaluate for foreign bodies Exploration:    Hemostasis achieved with:  Tourniquet   Wound exploration: wound explored through full range of motion   Treatment:    Area cleansed with:  Betadine   Amount of cleaning:  Extensive   Irrigation solution:  Tap water   Irrigation method:  Tap   Debridement:  Minimal Skin repair:    Repair method:  Sutures   Suture size:  5-0  Suture material:  Nylon   Suture technique:  Simple interrupted   Number of sutures:  11 (Nine 5-0 nylon; two 6-0 prolene) Approximation:    Approximation:  Close Post-procedure details:    Dressing:  Antibiotic ointment, sterile dressing and splint for protection   Patient tolerance of procedure:  Tolerated well, no immediate complications .Marland Kitchen.Laceration Repair Date/Time: 06/18/2017 9:30 PM Performed by: Anselm PancoastJoy, Shawn C, PA-C Authorized by: Anselm PancoastJoy, Shawn C, PA-C   Consent:    Consent obtained:  Verbal   Consent given by:  Patient   Risks discussed:  Infection, need for additional repair, nerve damage, pain, poor cosmetic result and poor wound healing Anesthesia (see MAR for exact dosages):    Anesthesia method:  Nerve block   Block location:  Digital   Block needle gauge:  25 G   Block anesthetic:  Bupivacaine 0.5% w/o epi   Block injection procedure:  Anatomic landmarks identified, anatomic landmarks palpated, introduced needle, negative aspiration for blood and incremental injection   Block outcome:  Anesthesia achieved Laceration details:    Location:  Finger   Finger location:  L long finger   Length (cm):  2.5 Repair type:    Repair type:  Complex Pre-procedure details:    Preparation:  Patient was prepped and draped in usual sterile fashion Exploration:    Hemostasis achieved with:  Tourniquet   Wound exploration: wound explored through full range of motion   Treatment:    Area cleansed with:  Betadine   Amount of cleaning:  Extensive   Irrigation solution:   Tap water   Irrigation method:  Tap   Debridement:  Minimal Skin repair:    Repair method:  Sutures   Suture size:  5-0   Suture material:  Nylon   Suture technique:  Simple interrupted   Number of sutures:  11 Approximation:    Approximation:  Close Post-procedure details:    Dressing:  Sterile dressing, antibiotic ointment and splint for protection   Patient tolerance of procedure:  Tolerated well, no immediate complications .Splint Application Date/Time: 06/18/2017 10:00 PM Performed by: Anselm PancoastJoy, Shawn C, PA-C Authorized by: Anselm PancoastJoy, Shawn C, PA-C   Consent:    Consent obtained:  Verbal   Consent given by:  Patient Pre-procedure details:    Sensation:  Normal   Skin color:  Normal Procedure details:    Laterality:  Left   Location:  Finger   Finger:  L index finger   Splint type:  Finger   Supplies:  Prefabricated splint Post-procedure details:    Pain:  Unchanged   Sensation:  Normal   Skin color:  Normal   Patient tolerance of procedure:  Tolerated well, no immediate complications Comments:     Procedure was performed by the Med Tech with my evaluation before and after. I was available for consultation throughout the procedure. Marland Kitchen.Splint Application Date/Time: 06/18/2017 10:00 PM Performed by: Anselm PancoastJoy, Shawn C, PA-C Authorized by: Anselm PancoastJoy, Shawn C, PA-C   Consent:    Consent obtained:  Verbal   Consent given by:  Patient Pre-procedure details:    Sensation:  Normal   Skin color:  Normal Procedure details:    Laterality:  Left   Location:  Finger   Finger:  L long finger   Splint type:  Finger   Supplies:  Prefabricated splint Post-procedure details:    Pain:  Unchanged   Sensation:  Normal   Skin color:  Normal   Patient tolerance of procedure:  Tolerated well, no immediate  complications Comments:     Procedure was performed by the Med Tech with my evaluation before and after. I was available for consultation throughout the procedure.   (including critical care  time)  Medications Ordered in ED Medications  doxycycline (VIBRA-TABS) tablet 100 mg (100 mg Oral Given 06/18/17 2217)  Tdap (BOOSTRIX) injection 0.5 mL (0.5 mLs Intramuscular Given 06/18/17 2217)     Initial Impression / Assessment and Plan / ED Course  I have reviewed the triage vital signs and the nursing notes.  Pertinent labs & imaging results that were available during my care of the patient were reviewed by me and considered in my medical decision making (see chart for details).  Clinical Course as of Jun 19 1736  Mon Jun 18, 2017  2204 Spoke with Dr. Eulah Pont, hand surgeon on call. Agrees with static finger splints, antibiotics, and office follow up.    [SJ]    Clinical Course User Index [SJ] Joy, Shawn C, PA-C    Patient presents with lacerations to the left fingers.  Imaging shows underlying fractures in index and middle fingers.  Extensive laceration repairs performed in the ED.  Motor function and circulation intact before and after repair.   Final Clinical Impressions(s) / ED Diagnoses   Final diagnoses:  Open displaced fracture of distal phalanx of finger of left hand  Laceration of left index finger without foreign body with damage to nail, initial encounter  Laceration of left middle finger without foreign body without damage to nail, initial encounter  Laceration of left ring finger without foreign body without damage to nail, initial encounter    ED Discharge Orders        Ordered    doxycycline (VIBRAMYCIN) 100 MG capsule  2 times daily     06/18/17 2214       Anselm Pancoast, PA-C 06/19/17 1749    Pricilla Loveless, MD 06/27/17 1520

## 2017-06-18 NOTE — ED Notes (Signed)
ED Provider at bedside. 

## 2017-06-18 NOTE — ED Notes (Signed)
Patient understood directions of cleaning and taking care of wounds by EMT. EMT gave extra wound care supplies to patient for the next couple days until pt sees a hand specialist.

## 2017-06-18 NOTE — ED Triage Notes (Signed)
Laceration to the right hand 2nd, 3rd, and 4th fingers by hedge trimer. Bleeding profusely at triage, pressure dressing applied.

## 2017-06-18 NOTE — ED Notes (Addendum)
ED Provider at bedside suturing.  

## 2018-12-21 IMAGING — US US THYROID
1 series · 13 of 25 positions shown · non-contrast
Comparison: 05/31/2012

CLINICAL DATA: Goiter

EXAM:
THYROID ULTRASOUND
TECHNIQUE: Ultrasound examination of the thyroid gland and adjacent soft
tissues was performed.

[Series 1: us thyroid · 0.04mm/px · 13 of 52 slices shown]
[im 1/52]
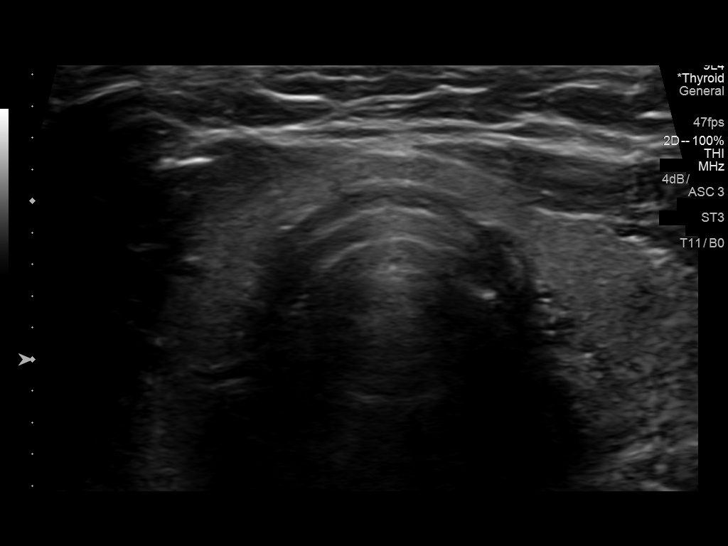
[im 5/52]
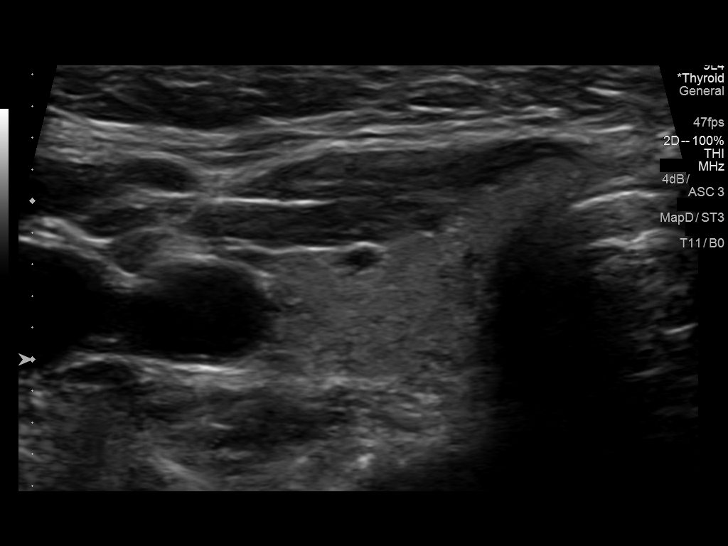
[im 9/52]
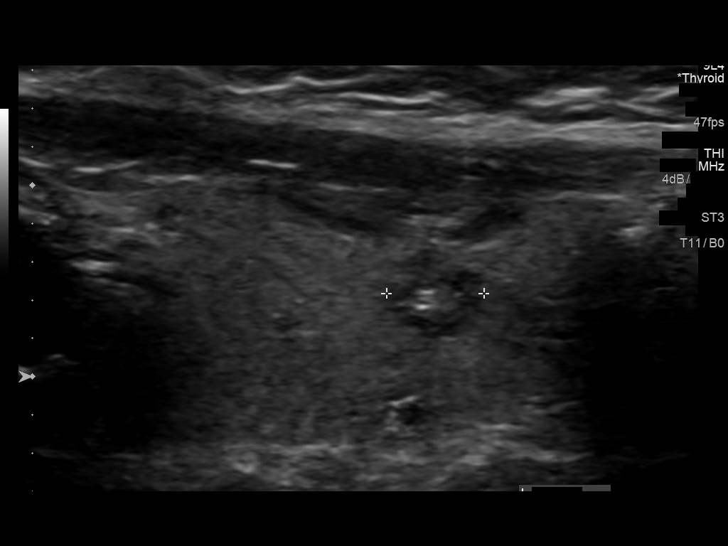
[im 13/52]
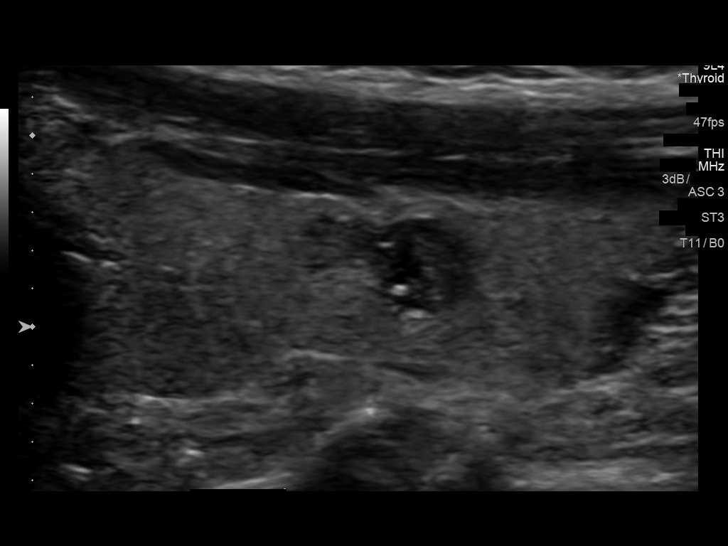
[im 18/52]
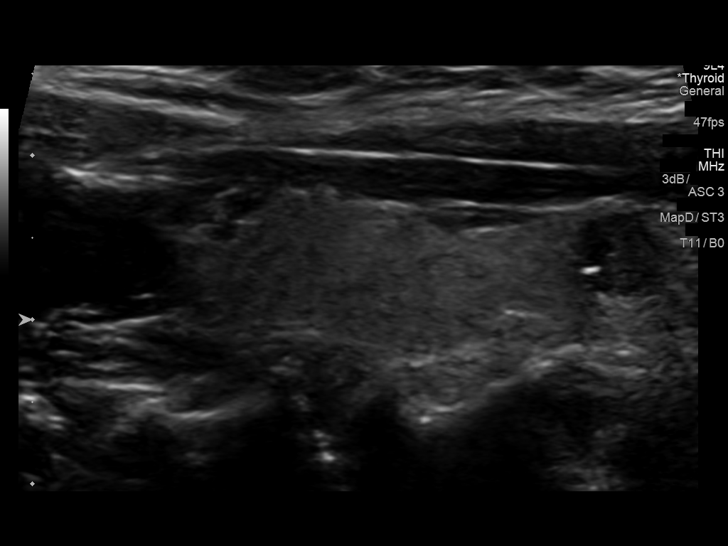
[im 22/52]
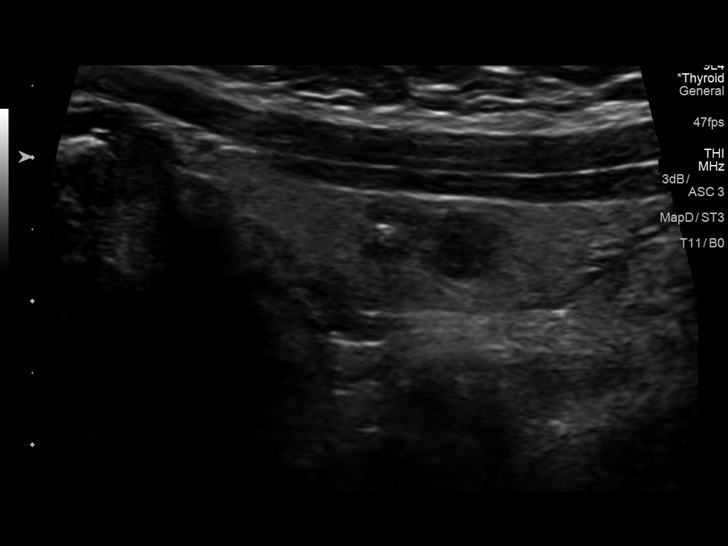
[im 26/52]
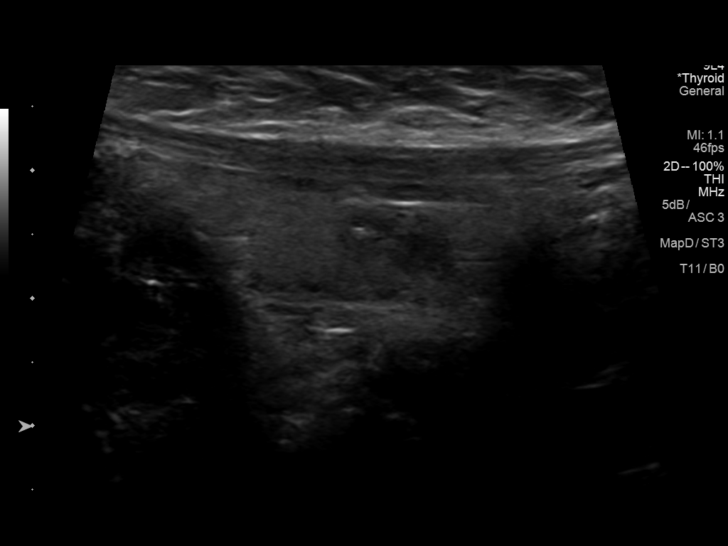
[im 30/52]
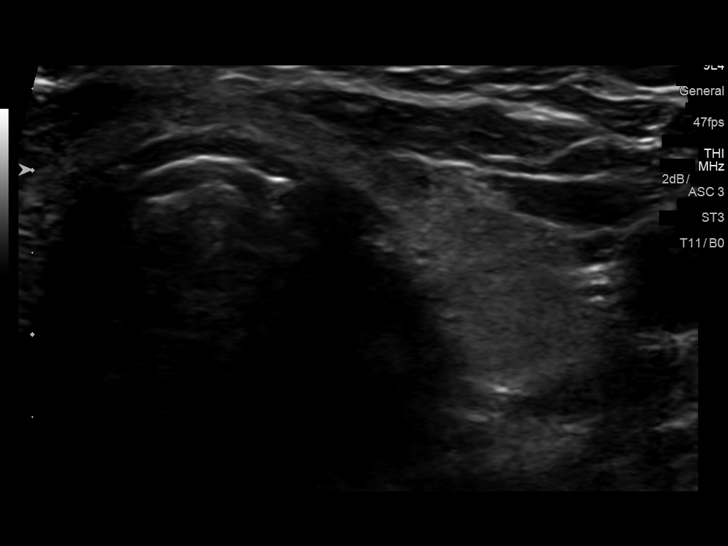
[im 35/52]
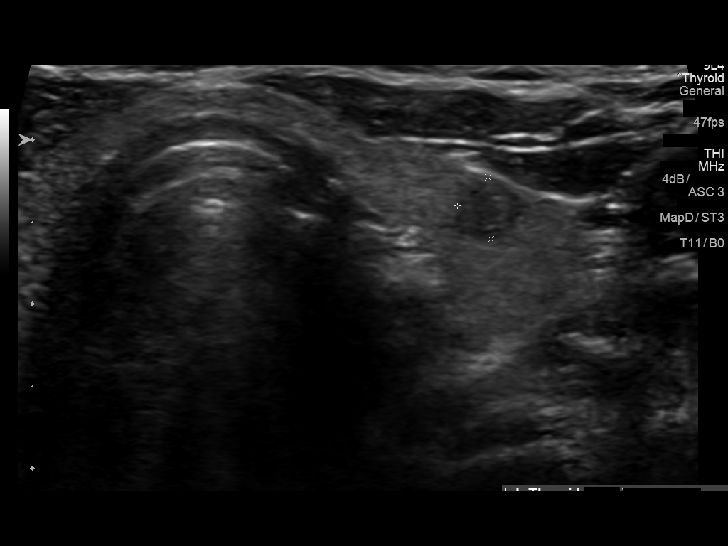
[im 39/52]
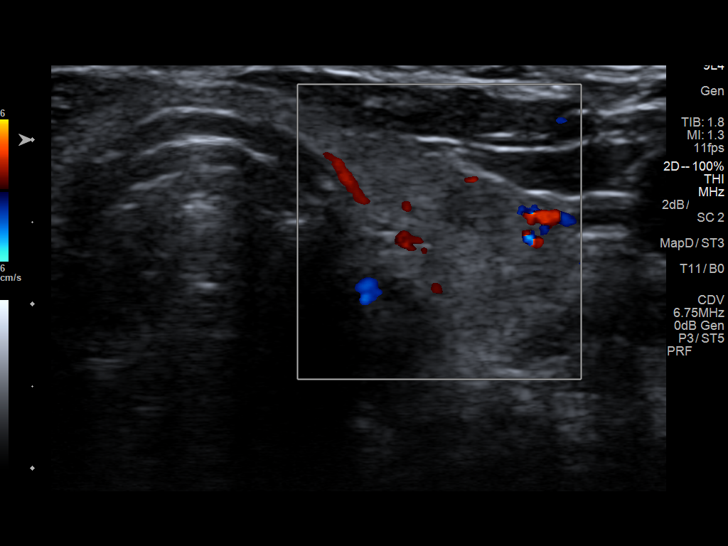
[im 43/52]
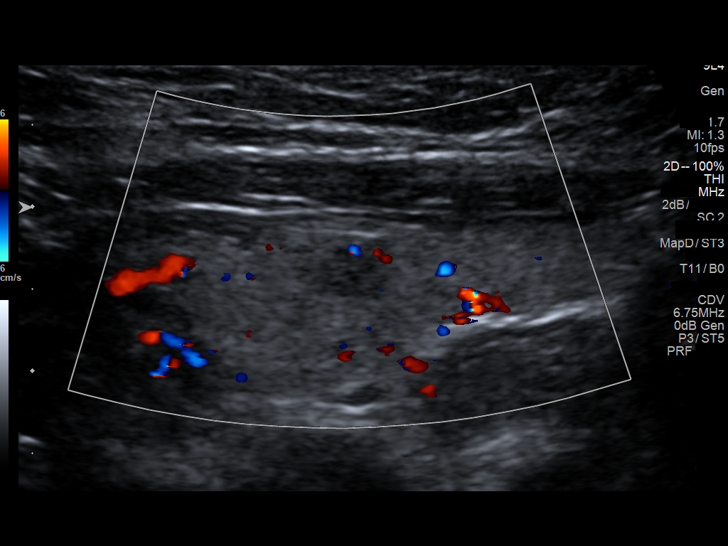
[im 47/52]
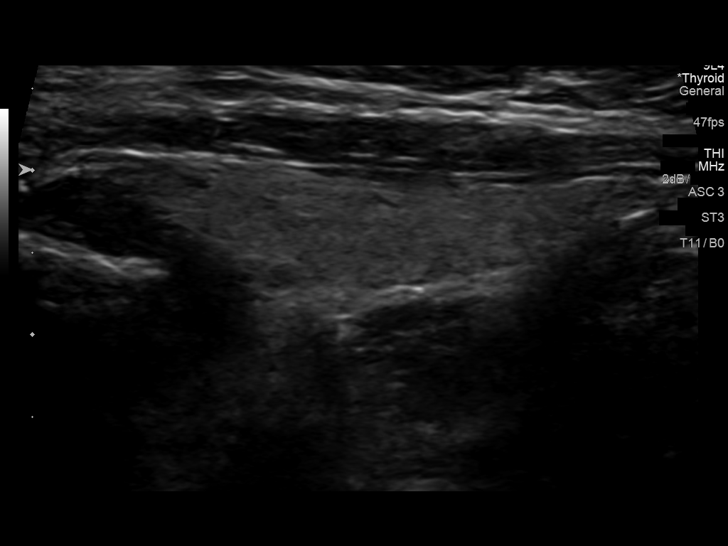
[im 52/52]
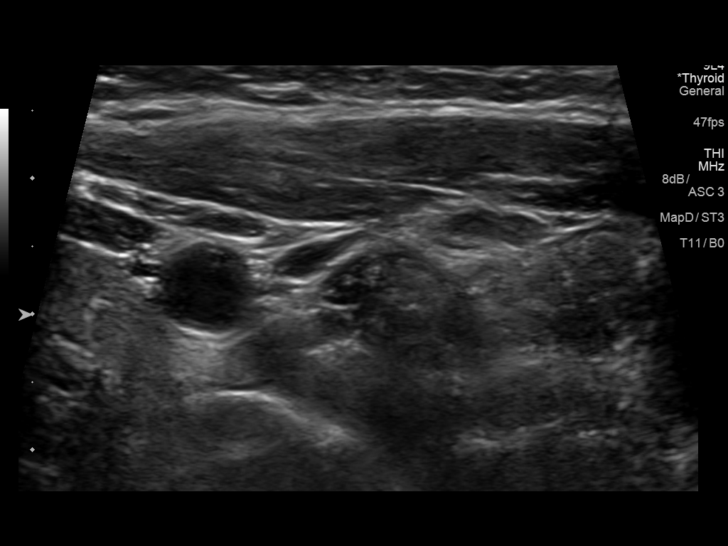

[13 of 25 positions shown; findings below may reference images not displayed]

FINDINGS: Parenchymal Echotexture: Mildly heterogenous

Isthmus: 0.2 cm thickness, previously

Right lobe: 4.2 x 0.9 x 1.4 cm (previously 4.2 x 0.9 x 1.5)

Left lobe: 3.5 x 1 x 1.4 cm (previously 3.6 x 1.1 x 1.3)

_________________________________________________________

Estimated total number of nodules >/= 1 cm: 0

Number of spongiform nodules >/=  2 cm not described below (TR1): 0

Number of mixed cystic and solid nodules >/= 1.5 cm not described
below (TR2): 0

Nodule # 1:

Location: Right; Mid

Maximum size: 0.5 cm; Other 2 dimensions: 0.5 x 0.5, previously
cm

Composition: cannot determine (2)

Echogenicity: hypoechoic (2)

Shape: not taller-than-wide (0)

Margins: ill-defined (0)

Echogenic foci: punctate echogenic foci (3)

ACR TI-RADS total points: 7.

ACR TI-RADS risk category: TR5 (>/= 7 points).

ACR TI-RADS recommendations:

*Given size (>/= 0.5 - 0.9 cm) and appearance, a follow-up
ultrasound in 1 year should be considered based on TI-RADS criteria.

0.6 cm hypoechoic nodule, mid right lobe, stable

0.5 cm hypoechoic nodule, mid left lobe, previously 0.7 cm.

0.4 cm hypoechoic nodule, superior pole left lobe, previously
IMPRESSION: 1. Normal-sized thyroid with small bilateral nodules.
2. Recommend 1 year follow-up ultrasound of new 5 mm mid right
nodule with calcifications.

The above is in keeping with the ACR TI-RADS recommendations - [HOSPITAL] 4329;[DATE].

## 2019-07-24 IMAGING — CR DG HAND COMPLETE 3+V*L*
3 series · 3 of 3 positions shown · non-contrast
Comparison: None.

CLINICAL DATA: Lacerations to the distal second, third, and fourth
fingers after Rubal Contracting injury.

EXAM:
LEFT HAND - COMPLETE 3+ VIEW

[x hand pa left]
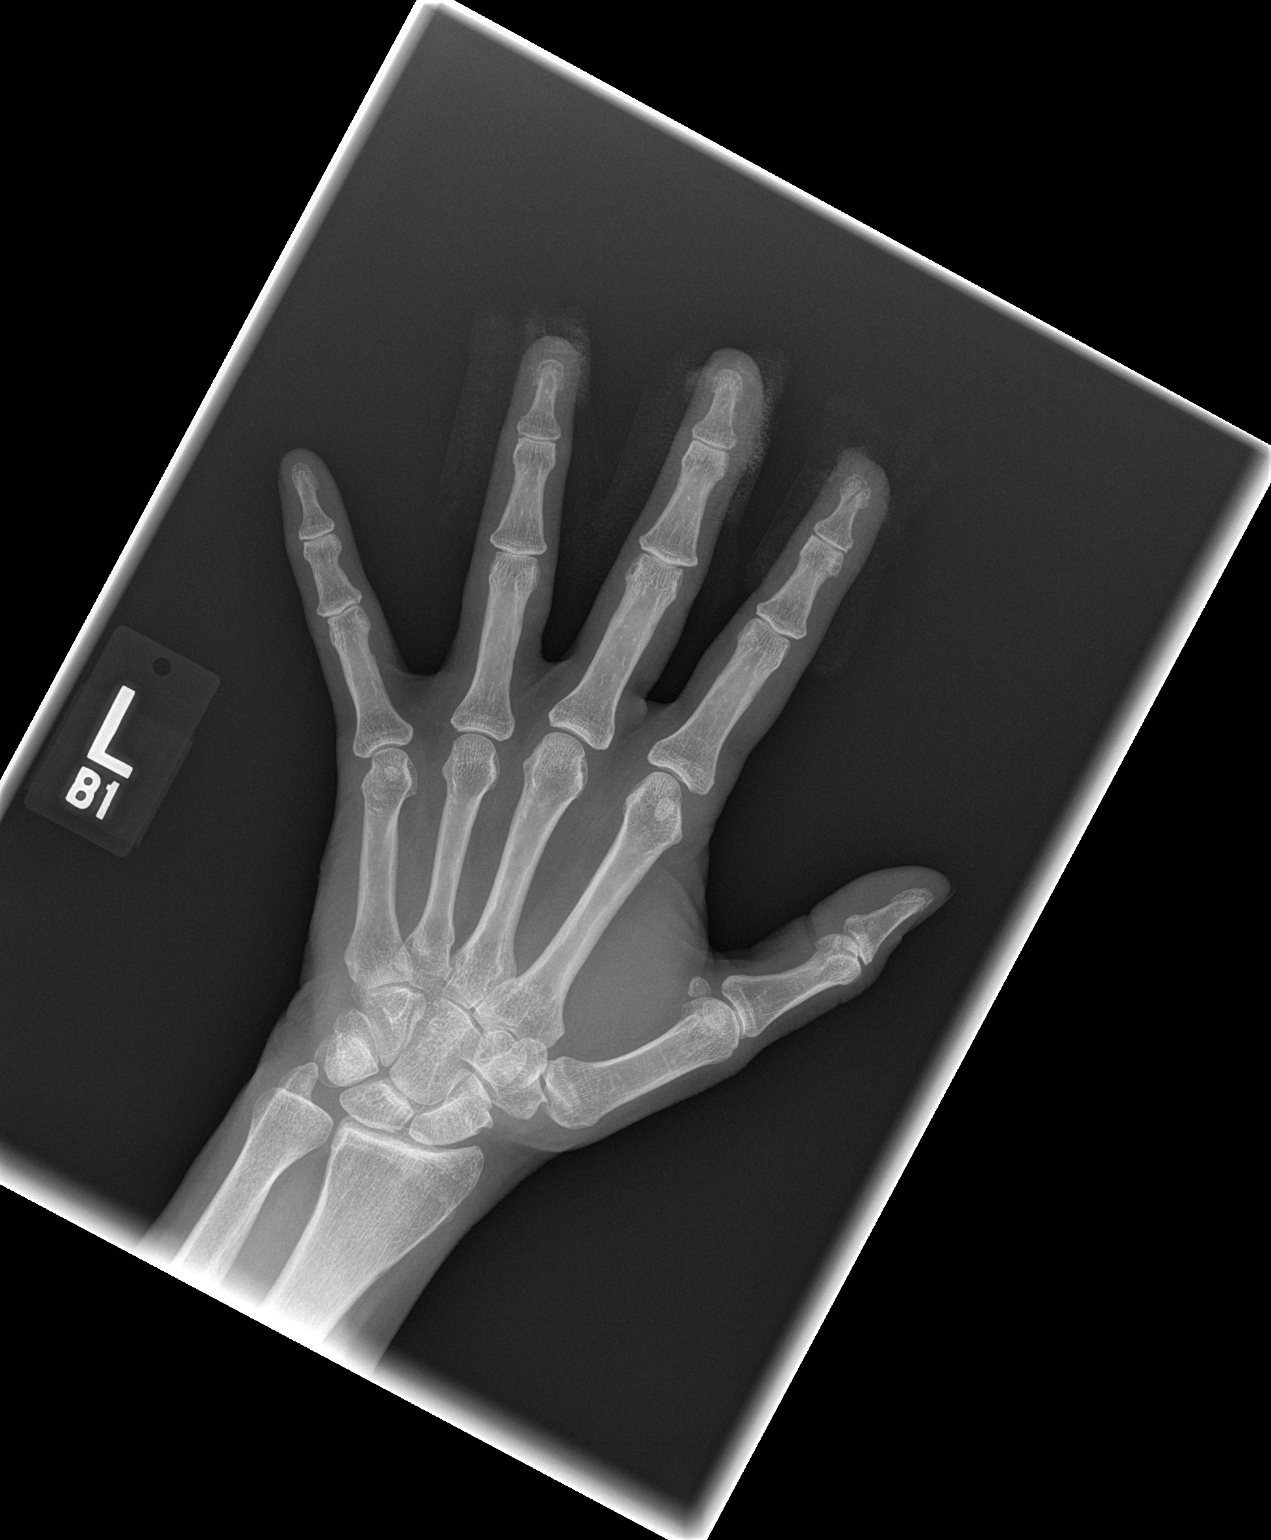

[x hand oblique left]
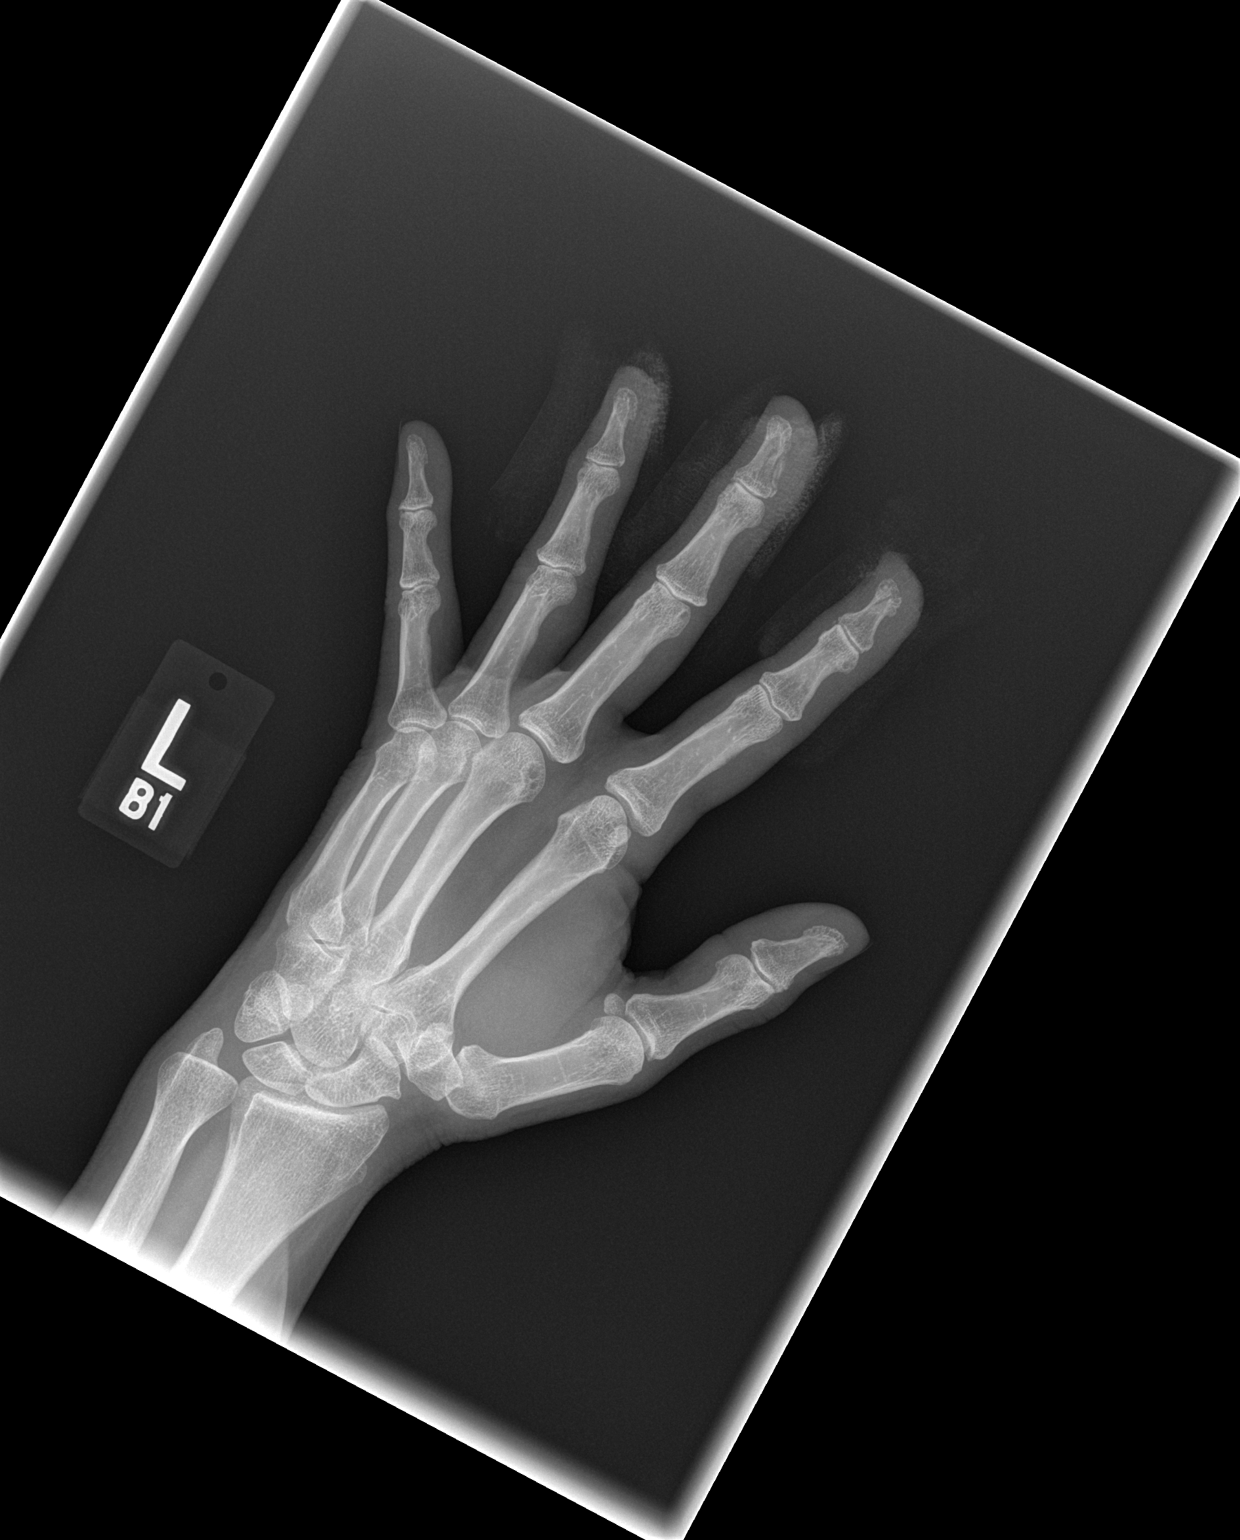

[x hand lat left]
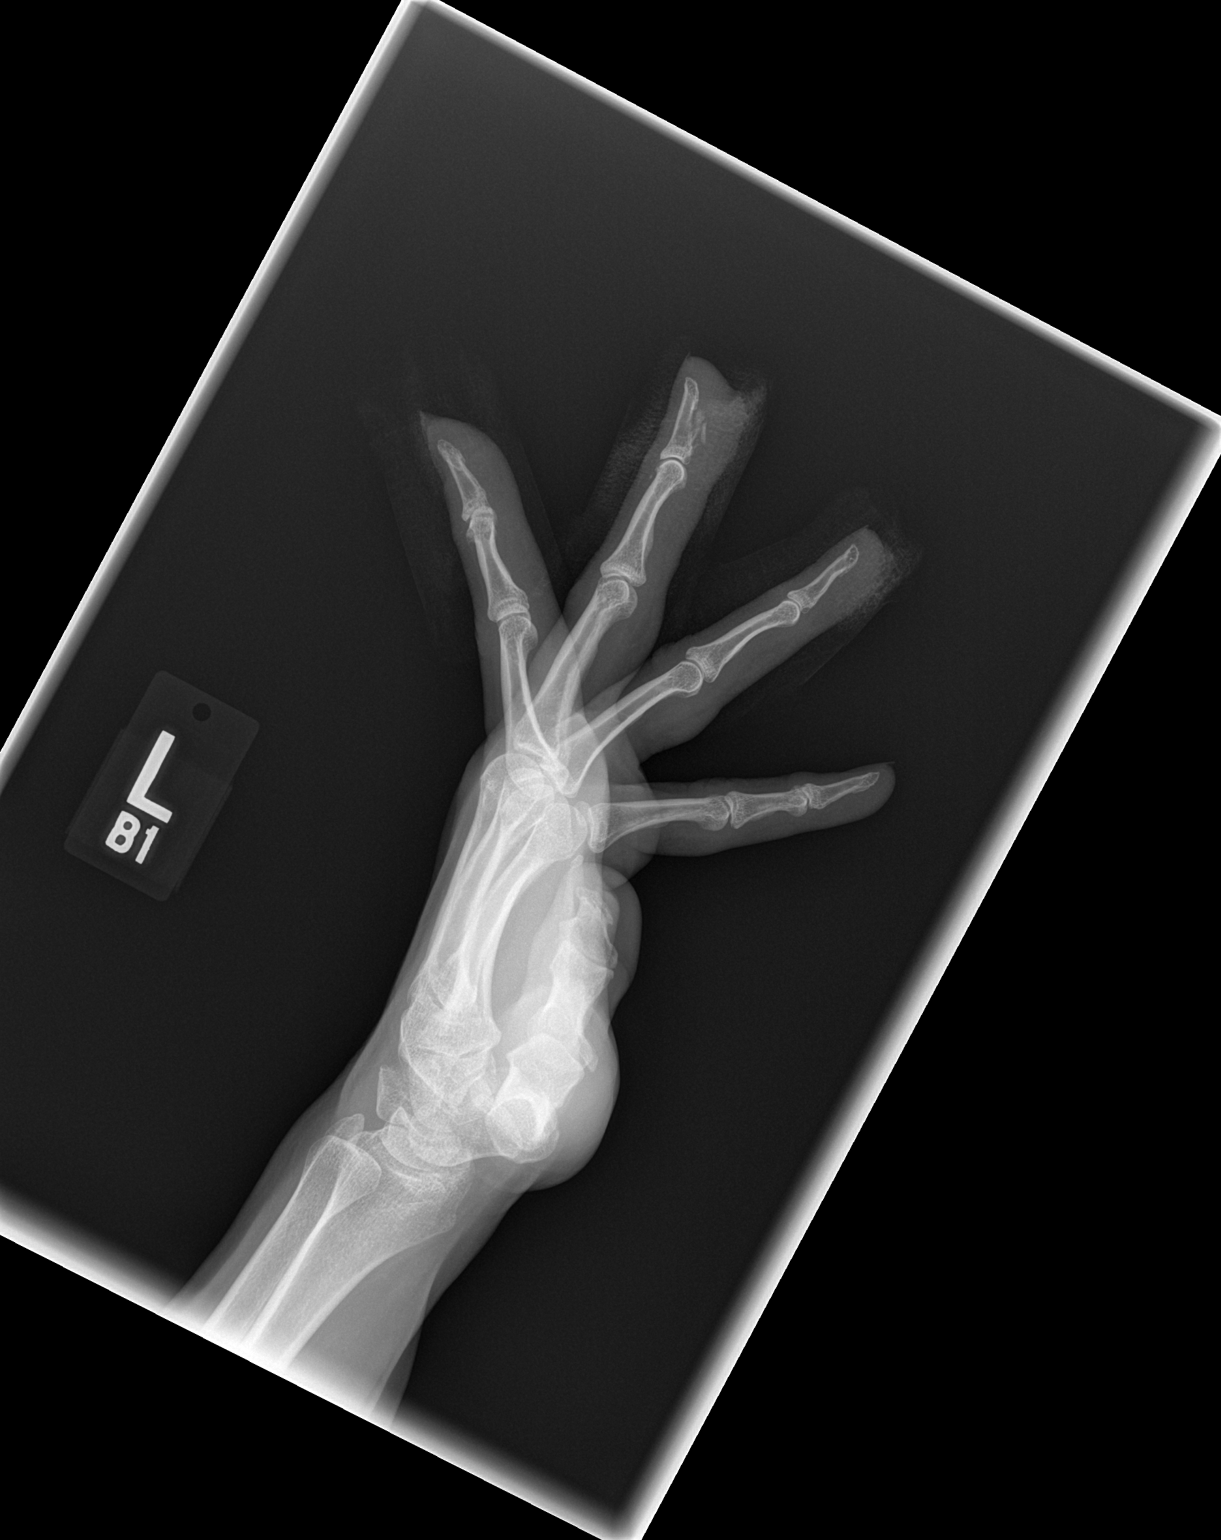

[3 of 3 positions shown; findings below may reference images not displayed]

FINDINGS: Nondisplaced fracture through the distal tuft of the index finger.
Comminuted fracture along the volar aspect of the middle finger
distal phalanx. Minimal degenerative changes of the IP joints. Joint
spaces are preserved. Bone mineralization is normal. Soft tissue
lacerations at the volar tips of the index, middle, and ring
fingers.
IMPRESSION: 1. Nondisplaced fracture of the index finger distal tuft.
2. Comminuted fracture of the volar middle finger distal phalanx.

## 2019-07-31 ENCOUNTER — Ambulatory Visit (INDEPENDENT_AMBULATORY_CARE_PROVIDER_SITE_OTHER): Payer: 59 | Admitting: Sports Medicine

## 2019-07-31 ENCOUNTER — Other Ambulatory Visit: Payer: Self-pay

## 2019-07-31 ENCOUNTER — Encounter: Payer: Self-pay | Admitting: Sports Medicine

## 2019-07-31 VITALS — BP 128/69 | Ht 69.0 in | Wt 258.0 lb

## 2019-07-31 DIAGNOSIS — M21962 Unspecified acquired deformity of left lower leg: Secondary | ICD-10-CM

## 2019-07-31 DIAGNOSIS — M21961 Unspecified acquired deformity of right lower leg: Secondary | ICD-10-CM

## 2019-07-31 NOTE — Progress Notes (Signed)
PCP: Deatra James, MD  Subjective:   HPI: Patient is a 54 y.o. female here for custom orthotics.  Patient had orthotics made 5 years ago as part of her treatment plan for plantar fasciitis.  She wore out her old set of orthotics and comes in today to have new custom orthotics made.  Patient notes while wearing the orthotics she is not having any pain.  Previously she was having significant pain over the arch of her foot.  Patient has no other questions or concerns today.   Review of Systems: See HPI above.  Past Medical History:  Diagnosis Date  . Allergy   . Constipation    after gastric surgery having issues   . Depression   . Endometriosis   . Sleep apnea    wears cpap  . Thyroid goiter     Current Outpatient Medications on File Prior to Visit  Medication Sig Dispense Refill  . BIOTIN PO Take 5,000 mcg by mouth daily. 2 tablets daily    . cholecalciferol (VITAMIN D) 1000 units tablet Take 1,000 Units by mouth daily.    . clobetasol (TEMOVATE) 0.05 % external solution Apply topically.    . docusate calcium (SURFAK) 240 MG capsule Take 240 mg by mouth daily as needed for mild constipation.    . Doxycycline Hyclate 150 MG TABS Take 1 tablet by mouth daily.    . folic acid (FOLVITE) 400 MCG tablet Take 400 mcg by mouth daily.    . Lactobacillus-Inulin (CULTURELLE DIGESTIVE HEALTH PO) Take 1 capsule by mouth daily.    Marland Kitchen levocetirizine (XYZAL) 5 MG tablet Take 5 mg by mouth every evening.    Marland Kitchen levothyroxine (SYNTHROID, LEVOTHROID) 137 MCG tablet Take 137 mcg by mouth. Take 1 tablet M-Sat, Take 1.5 tablets on Sun    . LEVOXYL 125 MCG tablet     . MAGNESIUM GLUCONATE PO Take 200 mg by mouth.    . meloxicam (MOBIC) 15 MG tablet Take 15 mg by mouth daily as needed.     . metroNIDAZOLE (METROCREAM) 0.75 % cream Apply 1 application topically as needed.     . montelukast (SINGULAIR) 10 MG tablet Take 10 mg by mouth at bedtime.    . multivitamin-iron-minerals-folic acid (THERAPEUTIC-M)  TABS tablet Take 1 tablet by mouth daily.    . polyethylene glycol (MIRALAX / GLYCOLAX) packet Take 17 g by mouth daily.    . valACYclovir (VALTREX) 500 MG tablet Take 2 tablets by mouth daily as needed.    . vortioxetine HBr (TRINTELLIX) 20 MG TABS TAKE 1 TABLET BY MOUTH EVERY DAY     No current facility-administered medications on file prior to visit.    Past Surgical History:  Procedure Laterality Date  . BIOPSY THYROID    . BREAST BIOPSY Left   . BREAST BIOPSY Right   . CHOLECYSTECTOMY  1999  . COLONOSCOPY     15 yrs ago  . LAPAROSCOPIC GASTRIC BAND REMOVAL WITH LAPAROSCOPIC GASTRIC SLEEVE RESECTION  12/2014  . LAPAROTOMY      Allergies  Allergen Reactions  . Cefaclor Other (See Comments)    rash    Social History   Socioeconomic History  . Marital status: Married    Spouse name: Not on file  . Number of children: Not on file  . Years of education: Not on file  . Highest education level: Not on file  Occupational History  . Not on file  Tobacco Use  . Smoking status: Never Smoker  .  Smokeless tobacco: Never Used  Substance and Sexual Activity  . Alcohol use: No  . Drug use: No  . Sexual activity: Not on file  Other Topics Concern  . Not on file  Social History Narrative    married    [employer]   Ft  Work    Neg tad    Social Determinants of Health   Financial Resource Strain:   . Difficulty of Paying Living Expenses:   Food Insecurity:   . Worried About Charity fundraiser in the Last Year:   . Arboriculturist in the Last Year:   Transportation Needs:   . Film/video editor (Medical):   Marland Kitchen Lack of Transportation (Non-Medical):   Physical Activity:   . Days of Exercise per Week:   . Minutes of Exercise per Session:   Stress:   . Feeling of Stress :   Social Connections:   . Frequency of Communication with Friends and Family:   . Frequency of Social Gatherings with Friends and Family:   . Attends Religious Services:   . Active  Member of Clubs or Organizations:   . Attends Archivist Meetings:   Marland Kitchen Marital Status:   Intimate Partner Violence:   . Fear of Current or Ex-Partner:   . Emotionally Abused:   Marland Kitchen Physically Abused:   . Sexually Abused:     Family History  Problem Relation Age of Onset  . Heart disease Father        stent  . Sleep apnea Father   . Depression Brother        bipolar  . Hyperlipidemia Unknown   . Thyroid disease Unknown   . Colon polyps Mother   . Colon cancer Neg Hx   . Esophageal cancer Neg Hx   . Rectal cancer Neg Hx   . Stomach cancer Neg Hx         Objective:  Physical Exam: BP 128/69   Ht 5\' 9"  (1.753 m)   Wt 258 lb (117 kg)   BMI 38.10 kg/m  Gen: NAD, comfortable in exam room Lungs: Breathing comfortably on room air Ankle/Foot Exam Bilateral -Inspection: Mild flattening of the longitudinal arch.  Mild splaying of the forefoot.  Mild calcaneal valgus -Palpation: No tenderness to palpation -ROM: Normal ROM with dorsiflexion, plantarflexion, inversion, eversion -Limb neurovascularly intact, no instability noted  -Gait: Normal    Assessment & Plan:  Patient is a 54 y.o. female here for custom orthotics  1.  Bilateral pes planus Patient was fitted for a : standard, cushioned, semi-rigid orthotic. The orthotic was heated and afterward the patient stood on the orthotic blank positioned on the orthotic stand. The patient was positioned in subtalar neutral position and 10 degrees of ankle dorsiflexion in a weight bearing stance. After completion of molding, a stable base was applied to the orthotic blank. The blank was ground to a stable position for weight bearing. Size: 8 Base: Medium density Blue EVA Posting: None  additional orthotic padding: None  2 pairs of orthotics are made using the above protocol.  Patient notes comfort while walking her orthotics.  She will break these in and follow back up for adjustments as needed

## 2020-12-27 ENCOUNTER — Ambulatory Visit: Payer: 59 | Admitting: Physical Therapy

## 2020-12-28 ENCOUNTER — Ambulatory Visit: Payer: 59 | Attending: Student | Admitting: Physical Therapy

## 2020-12-28 ENCOUNTER — Other Ambulatory Visit: Payer: Self-pay

## 2020-12-28 DIAGNOSIS — M6281 Muscle weakness (generalized): Secondary | ICD-10-CM | POA: Insufficient documentation

## 2020-12-28 DIAGNOSIS — M25561 Pain in right knee: Secondary | ICD-10-CM | POA: Insufficient documentation

## 2020-12-28 DIAGNOSIS — M25661 Stiffness of right knee, not elsewhere classified: Secondary | ICD-10-CM | POA: Diagnosis present

## 2020-12-28 DIAGNOSIS — R2689 Other abnormalities of gait and mobility: Secondary | ICD-10-CM | POA: Insufficient documentation

## 2020-12-28 DIAGNOSIS — G8929 Other chronic pain: Secondary | ICD-10-CM | POA: Diagnosis present

## 2020-12-28 DIAGNOSIS — M25562 Pain in left knee: Secondary | ICD-10-CM | POA: Insufficient documentation

## 2020-12-28 NOTE — Therapy (Signed)
[facility] [address] [place], [place], [address] Phone: [phone]   Fax:  [phone]  Physical Therapy Evaluation  Patient Details  Name: Maria Terrell MRN: [id number] Date of Birth: 1965/08/13 Referring Provider (PT): Arther Abbott PA   Encounter Date: 12/28/2020   PT End of Session - 12/28/20 1157     Visit Number 1    Number of Visits 12    Date for PT Re-Evaluation 02/08/21    Authorization Type [employer]    PT Start Time 1100    PT Stop Time 1145    PT Time Calculation (min) 45 min    Activity Tolerance Patient tolerated treatment well    Behavior During Therapy [facility] for tasks assessed/performed             Past Medical History:  Diagnosis Date   Allergy    Constipation    after gastric surgery having issues    Depression    Endometriosis    Sleep apnea    wears cpap   Thyroid goiter     Past Surgical History:  Procedure Laterality Date   BIOPSY THYROID     BREAST BIOPSY Left    BREAST BIOPSY Right    CHOLECYSTECTOMY  1999   COLONOSCOPY     15 yrs ago   LAPAROSCOPIC GASTRIC BAND REMOVAL WITH LAPAROSCOPIC GASTRIC SLEEVE RESECTION  12/2014   LAPAROTOMY      There were no vitals filed for this visit.    Subjective Assessment - 12/28/20 1102     Subjective Pt notes she went to [facility] for her knees (R worse than L). Pain with stairs and increased walking (more with inclines and declines). X-rays performed and arthritis underneath knee caps were noted as well as "knee caps are being pulled more outward". Pt notes she got injection in the R knee which helped but wore off after ~a week. Pt got approved for gel injection but hasn't gotten it yet. Recommended by ortho to strengthen quads and legs.    Limitations Standing;Walking;House hold activities    How long can you sit comfortably? n/a    How long can you stand comfortably? n/a more limited by back    How long can you walk comfortably?  ~20 minutes (lap around neighborhood; usually able to do 2 laps)    Patient Stated Goals Improve pain and work on strength    Currently in Pain? 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    Referring Provider (PT) Arther Abbott PA    Onset Date/Surgical Date --   A few years onset   Prior Therapy None      Precautions   Precautions None      Restrictions  Weight Bearing Restrictions No      Balance Screen   Has the patient fallen in the past 6 months No      Home Environment   Living Environment Private residence    Living Arrangements Spouse/significant other    Available Help at Discharge Family    Home Access Stairs to enter    Entrance Stairs-Number of Steps 2    Home Layout Two level    Alternate Level Stairs-Number of Steps 12-15 steps    Alternate Level Stairs-Rails Right      Prior Function   Level of Independence Independent    Vocation Retired   Recently in the last few months   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    Leisure Walking      Cognition   Overall Cognitive Status Within Functional Limits for tasks assessed      Observation/Other Assessments   Focus on Therapeutic Outcomes (FOTO)  50 (risk adjusted 49); predicted 62      Functional Tests   Functional tests Step up;Step down      Step Up   Comments No pain      Step Down   Comments Increased knee valgus with decreased hip  flexion; reports pain with transition of knee over toe      ROM / Strength   AROM / PROM / Strength AROM;Strength      AROM   AROM Assessment Site Knee    Right/Left Knee Right;Left    Right Knee Extension 5    Right Knee Flexion 105    Left Knee Extension -3    Left Knee Flexion 120      Strength   Strength Assessment Site Hip;Knee    Right/Left Hip Right;Left    Right Hip Flexion 4-/5    Right Hip Extension 4/5    Right Hip External Rotation  4+/5    Right Hip Internal Rotation 4-/5    Right Hip ABduction 4+/5    Right Hip ADduction 4+/5    Left Hip Flexion 4-/5    Left Hip Extension 4/5    Left Hip External Rotation 4+/5    Left Hip Internal Rotation 4-/5    Left Hip ABduction 3+/5    Left Hip ADduction 4+/5    Right/Left Knee Left;Right    Right Knee Flexion 4/5    Right Knee Extension 4-/5    Left Knee Flexion 5/5    Left Knee Extension 5/5      Flexibility   Soft Tissue Assessment /Muscle Length yes    Hamstrings >90 deg bilat during SLR    Quadriceps 90 deg on R, 100 deg on L    Piriformis L tighter than R      Palpation   Patella mobility Hypomobile on R especially with medial glides; patellas positioned slightly more laterally bilaterally      Special Tests    Special Tests Knee Special Tests    Knee Special tests  McConnell Test;Patellofemoral Apprehension Test;Patellofemoral Grind Test (Clarke's Sign)      McConnell Test   Findings Negative      Patellofemoral Apprehension Test    Findings Negative      Patellofemoral Grind test (Clark's Sign)   Findings Negative                        Objective measurements completed on examination: See above findings.       [facility] Adult PT Treatment/Exercise - 12/28/20  0001       Exercises   Exercises Knee/Hip      Knee/Hip Exercises: Stretches   Quad Stretch 30 seconds;Right;Left      Knee/Hip Exercises: Standing   Wall Squat 10 reps;3 seconds    Wall Squat Limitations ball squeeze     Other Standing Knee Exercises lateral step down x10 each leg      Knee/Hip Exercises: Supine   Straight Leg Raises Strengthening;Both;10 reps   difficulty performing without extensor lag on R     Knee/Hip Exercises: Sidelying   Clams green tband; L only x10                     PT Education - 12/28/20 1157     Education Details Discussed exam findings, POC, and HEP.    Person(s) Educated Patient    Methods Explanation;Demonstration;Tactile cues;Verbal cues;Handout    Comprehension Verbalized understanding;Returned demonstration;Verbal cues required;Tactile cues required                 PT Long Term Goals - 12/28/20 1204       PT LONG TERM GOAL #1   Title Pt will be independent with advanced HEP    Time 6    Period Weeks    Status New    Target Date 02/08/21      PT LONG TERM GOAL #2   Title Pt will be able to ascend/descend steps carrying at least 20# with </=2/10 pain    Baseline 8/10 pain at worst    Time 6    Period Weeks    Status New    Target Date 02/08/21      PT LONG TERM GOAL #3   Title Pt will have R = L knee ROM    Baseline R knee 5 to 105 deg; L knee -3 to 120    Time 6    Period Weeks    Status New    Target Date 02/08/21      PT LONG TERM GOAL #4   Title Pt will be able to walk 2 laps around her neighborhood per her PLOF    Baseline Only tolerates 1 lap currently    Time 6    Period Weeks    Status New    Target Date 02/08/21      PT LONG TERM GOAL #5   Title Pt will have improved FOTO score to at least 62    Baseline 50    Time 6    Period Weeks    Status New    Target Date 02/08/21                    Plan - 12/28/20 1158     Clinical Impression Statement Mrs. Quamesha Mullet presents to OPPT due to complaint of bilat knee pain (R worse than L). On assessment, pt demos decreased R tibiofemoral and patellar AROM & PROM compared to L with weaker quad and decreased eccentric control. L hip significant for  weakness and stiffness compared to R leading to muscle imbalance. Pt would benefit from PT to address her mobility issues to improve her ability to perform stairs and walk inclines/declines for home and community mobility.    Personal Factors and Comorbidities Age;Fitness;Time since onset of injury/illness/exacerbation    Examination-Activity Limitations Locomotion Level;Transfers;Stairs;Squat;Caring for Others;Carry;Lift    Examination-Participation Restrictions Community Activity;Shop    Stability/Clinical Decision Making Stable/Uncomplicated    Clinical Decision Making Low  Rehab Potential Good    PT Frequency --   2x/wk for 4 weeks; transition to 1x/wk for last 2 weeks   PT Duration 6 weeks    PT Treatment/Interventions ADLs/Self Care Home Management;Aquatic Therapy;Cryotherapy;Electrical Stimulation;Iontophoresis 4mg /ml Dexamethasone;Gait training;Stair training;Functional mobility training;Therapeutic activities;Therapeutic exercise;Balance training;Neuromuscular re-education;Manual techniques;Patient/family education;Dry needling;Taping    PT Next Visit Plan Review HEP and modify as needed. L hip strengthening; bilat quad strengthening/eccentric control. Work on steps. Machine strengthening (pt has gym membership and could benefit from education).    PT Home Exercise Plan Access Code [email or web address]    Consulted and Agree with Plan of Care Patient             Patient will benefit from skilled therapeutic intervention in order to improve the following deficits and impairments:  Decreased range of motion, Difficulty walking, Increased fascial restricitons, Pain, Decreased mobility, Decreased strength, Improper body mechanics, Decreased activity tolerance  Visit Diagnosis: Chronic pain of right knee  Stiffness of right knee, not elsewhere classified  Chronic pain of left knee  Muscle weakness (generalized)  Other abnormalities of gait and mobility     Problem List Patient Active  Problem List   Diagnosis Date Noted   History of mononucleosis 05/09/2017   Depression 05/09/2017   OSA on CPAP 05/09/2017   Chronic fatigue 05/09/2017   Hypothyroidism due to Hashimoto's thyroiditis 05/09/2017   Class 2 severe obesity with serious comorbidity and body mass index (BMI) of 37.0 to 37.9 in adult (HCC) 05/09/2017   Pain in joint, ankle and foot 07/10/2013   Excessive sleepiness 07/03/2012   Goiter 07/03/2012   Visit for preventive health examination 07/03/2012   Plantar fasciitis of right foot 04/16/2012   Foreign travel 04/09/2012   OTHER SPECIFIED DISORDER OF SKIN 01/28/2010   CARPAL TUNNEL SYNDROME, RIGHT 10/22/2009   IRON DEFICIENCY 07/28/2009   DIARRHEA 12/31/2008   OTHER SPECIFIED DISORDER OF RECTUM AND ANUS 07/24/2008   PRURITUS, ANAL 07/24/2008   SHOULDER PAIN, LEFT 01/24/2008   OBESITY 04/19/2007   LOW BACK PAIN, CHRONIC 03/13/2007   OBSTRUCTIVE SLEEP APNEA 02/26/2007   FATIGUE 01/04/2007   SYMPTOM, ENLARGEMENT, LYMPH NODES 12/20/2006   INTERTRIGO, CANDIDAL 11/16/2006   ABNORMAL RESULT, FUNCTION STUDY, THYROID 11/16/2006   DEPRESSION 09/06/2006   ALLERGIC RHINITIS 09/06/2006   ENDOMETRIOSIS NOS 09/06/2006    Alroy Portela April Dell Ponto, PT, DPT 12/28/2020, 12:12 PM  [facility] [address] [place], [place], [address] Phone: [phone]   Fax:  [phone]  Name: TAPANGA SHERRATT MRN: [id number] Date of Birth: 15-Mar-1965

## 2020-12-28 NOTE — Patient Instructions (Signed)
Access Code: [email or web address] URL: [email or web address] Date: 12/28/2020 Prepared by: Vernon Prey April Kirstie Peri  Exercises Supine Active Straight Leg Raise - 1 x daily - 7 x weekly - 2 sets - 10 reps Clamshell with Resistance - 1 x daily - 7 x weekly - 2 sets - 10 reps Wall Squat Hold with Ball - 1 x daily - 7 x weekly - 2 sets - 10 reps - 3 sec hold Standing Quad Stretch with Strap - 1 x daily - 7 x weekly - 2 sets - 30 sec hold

## 2021-01-04 ENCOUNTER — Ambulatory Visit: Payer: 59 | Admitting: Physical Therapy

## 2021-01-04 ENCOUNTER — Other Ambulatory Visit: Payer: Self-pay

## 2021-01-04 ENCOUNTER — Encounter: Payer: Self-pay | Admitting: Physical Therapy

## 2021-01-04 DIAGNOSIS — R2689 Other abnormalities of gait and mobility: Secondary | ICD-10-CM

## 2021-01-04 DIAGNOSIS — M25661 Stiffness of right knee, not elsewhere classified: Secondary | ICD-10-CM

## 2021-01-04 DIAGNOSIS — M25562 Pain in left knee: Secondary | ICD-10-CM

## 2021-01-04 DIAGNOSIS — M25561 Pain in right knee: Secondary | ICD-10-CM | POA: Diagnosis not present

## 2021-01-04 DIAGNOSIS — M6281 Muscle weakness (generalized): Secondary | ICD-10-CM

## 2021-01-04 DIAGNOSIS — G8929 Other chronic pain: Secondary | ICD-10-CM

## 2021-01-04 NOTE — Therapy (Signed)
[facility] [address] [place], [place], [address] Phone: [phone]   Fax:  [phone]  Physical Therapy Treatment  Patient Details  Name: Maria Terrell MRN: [id number] Date of Birth: 1965/12/05 Referring Provider (PT): Arther Abbott PA   Encounter Date: 01/04/2021   PT End of Session - 01/04/21 9983     Visit Number 2    Number of Visits 12    Date for PT Re-Evaluation 02/08/21    Authorization Type [employer]    PT Start Time 0928    PT Stop Time 1013    PT Time Calculation (min) 45 min    Activity Tolerance Patient tolerated treatment well    Behavior During Therapy [facility] for tasks assessed/performed             Past Medical History:  Diagnosis Date   Allergy    Constipation    after gastric surgery having issues    Depression    Endometriosis    Sleep apnea    wears cpap   Thyroid goiter     Past Surgical History:  Procedure Laterality Date   BIOPSY THYROID     BREAST BIOPSY Left    BREAST BIOPSY Right    CHOLECYSTECTOMY  1999   COLONOSCOPY     15 yrs ago   LAPAROSCOPIC GASTRIC BAND REMOVAL WITH LAPAROSCOPIC GASTRIC SLEEVE RESECTION  12/2014   LAPAROTOMY      There were no vitals filed for this visit.   Subjective Assessment - 01/04/21 0928     Subjective I've got 15 stairs at home and they are about 8 inches high. Stairs are where I get the most pain.  I didn't do HEP last week except once due to watching my niece.    Patient Stated Goals Improve pain and work on strength    Currently in Pain? No/denies    Pain Location Knee    Pain Orientation Right;Left    Pain Descriptors / Indicators Sore                               [facility] Adult PT Treatment/Exercise - 01/04/21 0001       Knee/Hip Exercises: Stretches   Lobbyist Right;Left;1 rep;60 seconds    Quad Stretch Limitations 2 reps on right      Knee/Hip Exercises: Aerobic   Stationary Bike L2 x 5 min       Knee/Hip Exercises: Standing   Lateral Step Up Both;10 reps;Hand Hold: 2;Step Height: 6"    Lateral Step Up Limitations with contralateral hip ABD    Step Down Both;2 sets;10 reps;Hand Hold: 2;Step Height: 4"    Step Down Limitations heel taps lateral    Wall Squat 10 reps;2 sets;3 seconds    Wall Squat Limitations ball squeeze; did on theraball as pt having difficulty sliding on wall at home      Knee/Hip Exercises: Supine   Straight Leg Raises Strengthening;Both;10 reps      Knee/Hip Exercises: Sidelying   Clams green tband; L only 2x10      Manual Therapy   Manual Therapy Myofascial release;Soft tissue mobilization    Soft tissue mobilization to bil quads    Myofascial Release to bil ITB                          PT Long Term Goals - 12/28/20 1204  PT LONG TERM GOAL #1   Title Pt will be independent with advanced HEP    Time 6    Period Weeks    Status New    Target Date 02/08/21      PT LONG TERM GOAL #2   Title Pt will be able to ascend/descend steps carrying at least 20# with </=2/10 pain    Baseline 8/10 pain at worst    Time 6    Period Weeks    Status New    Target Date 02/08/21      PT LONG TERM GOAL #3   Title Pt will have R = L knee ROM    Baseline R knee 5 to 105 deg; L knee -3 to 120    Time 6    Period Weeks    Status New    Target Date 02/08/21      PT LONG TERM GOAL #4   Title Pt will be able to walk 2 laps around her neighborhood per her PLOF    Baseline Only tolerates 1 lap currently    Time 6    Period Weeks    Status New    Target Date 02/08/21      PT LONG TERM GOAL #5   Title Pt will have improved FOTO score to at least 62    Baseline 50    Time 6    Period Weeks    Status New    Target Date 02/08/21                   Plan - 01/04/21 1232     Clinical Impression Statement Patient tolerated TE well today but c/o pain in lateral thighs with clams. Multiple TPs and increased tone and tenderness bil  with manual therapy. Would benefit from DN.    PT Duration 6 weeks    PT Treatment/Interventions ADLs/Self Care Home Management;Aquatic Therapy;Cryotherapy;Electrical Stimulation;Iontophoresis 4mg /ml Dexamethasone;Gait training;Stair training;Functional mobility training;Therapeutic activities;Therapeutic exercise;Balance training;Neuromuscular re-education;Manual techniques;Patient/family education;Dry needling;Taping    PT Next Visit Plan DN/manual to lateral thighs.  L hip strengthening; bilat quad strengthening/eccentric control. Work on steps. Machine strengthening (pt has gym membership and could benefit from education).    PT Home Exercise Plan Access Code    Consulted and Agree with Plan of Care Patient             Patient will benefit from skilled therapeutic intervention in order to improve the following deficits and impairments:  Decreased range of motion, Difficulty walking, Increased fascial restricitons, Pain, Decreased mobility, Decreased strength, Improper body mechanics, Decreased activity tolerance  Visit Diagnosis: Chronic pain of right knee  Stiffness of right knee, not elsewhere classified  Chronic pain of left knee  Muscle weakness (generalized)  Other abnormalities of gait and mobility     Problem List Patient Active Problem List   Diagnosis Date Noted   History of mononucleosis 05/09/2017   Depression 05/09/2017   OSA on CPAP 05/09/2017   Chronic fatigue 05/09/2017   Hypothyroidism due to Hashimoto's thyroiditis 05/09/2017   Class 2 severe obesity with serious comorbidity and body mass index (BMI) of 37.0 to 37.9 in adult (HCC) 05/09/2017   Pain in joint, ankle and foot 07/10/2013   Excessive sleepiness 07/03/2012   Goiter 07/03/2012   Visit for preventive health examination 07/03/2012   Plantar fasciitis of right foot 04/16/2012   Foreign travel 04/09/2012   OTHER SPECIFIED DISORDER OF SKIN 01/28/2010   CARPAL TUNNEL SYNDROME, RIGHT  10/22/2009   IRON DEFICIENCY 07/28/2009   DIARRHEA 12/31/2008   OTHER SPECIFIED DISORDER OF RECTUM AND ANUS 07/24/2008   PRURITUS, ANAL 07/24/2008   SHOULDER PAIN, LEFT 01/24/2008   OBESITY 04/19/2007   LOW BACK PAIN, CHRONIC 03/13/2007   OBSTRUCTIVE SLEEP APNEA 02/26/2007   FATIGUE 01/04/2007   SYMPTOM, ENLARGEMENT, LYMPH NODES 12/20/2006   INTERTRIGO, CANDIDAL 11/16/2006   ABNORMAL RESULT, FUNCTION STUDY, THYROID 11/16/2006   DEPRESSION 09/06/2006   ALLERGIC RHINITIS 09/06/2006   ENDOMETRIOSIS NOS 09/06/2006   Solon Palm, PT 01/04/2021, 12:36 PM  [facility] [address] [place], [place], [address] Phone: [phone]   Fax:  [phone]  Name: KASSANDRA MERIWEATHER MRN: [id number] Date of Birth: 08-09-1965

## 2021-01-05 ENCOUNTER — Encounter: Payer: Self-pay | Admitting: Physical Therapy

## 2021-01-05 ENCOUNTER — Other Ambulatory Visit: Payer: Self-pay

## 2021-01-05 ENCOUNTER — Ambulatory Visit: Payer: 59 | Admitting: Physical Therapy

## 2021-01-05 DIAGNOSIS — M25661 Stiffness of right knee, not elsewhere classified: Secondary | ICD-10-CM

## 2021-01-05 DIAGNOSIS — M6281 Muscle weakness (generalized): Secondary | ICD-10-CM

## 2021-01-05 DIAGNOSIS — M25562 Pain in left knee: Secondary | ICD-10-CM

## 2021-01-05 DIAGNOSIS — M25561 Pain in right knee: Secondary | ICD-10-CM | POA: Diagnosis not present

## 2021-01-05 DIAGNOSIS — G8929 Other chronic pain: Secondary | ICD-10-CM

## 2021-01-05 DIAGNOSIS — R2689 Other abnormalities of gait and mobility: Secondary | ICD-10-CM

## 2021-01-05 NOTE — Therapy (Signed)
[facility] [address] [place], [place], [address] Phone: [phone]   Fax:  [phone]  Physical Therapy Treatment  Patient Details  Name: Maria Terrell MRN: [id number] Date of Birth: Feb 16, 1966 Referring Provider (PT): Arther Abbott PA   Encounter Date: 01/05/2021   PT End of Session - 01/05/21 0758     Visit Number 3    Number of Visits 12    Date for PT Re-Evaluation 02/08/21    Authorization Type [employer]    PT Start Time 0800    PT Stop Time 0853    PT Time Calculation (min) 53 min    Activity Tolerance Patient tolerated treatment well    Behavior During Therapy [facility] for tasks assessed/performed             Past Medical History:  Diagnosis Date   Allergy    Constipation    after gastric surgery having issues    Depression    Endometriosis    Sleep apnea    wears cpap   Thyroid goiter     Past Surgical History:  Procedure Laterality Date   BIOPSY THYROID     BREAST BIOPSY Left    BREAST BIOPSY Right    CHOLECYSTECTOMY  1999   COLONOSCOPY     15 yrs ago   LAPAROSCOPIC GASTRIC BAND REMOVAL WITH LAPAROSCOPIC GASTRIC SLEEVE RESECTION  12/2014   LAPAROTOMY      There were no vitals filed for this visit.   Subjective Assessment - 01/05/21 0802     Subjective 7/8 pain coming down stairs this am. 1-2/10 right now.    Patient Stated Goals Improve pain and work on strength    Currently in Pain? Yes    Pain Score 2     Pain Location Knee    Pain Orientation Right;Left    Pain Descriptors / Indicators Sore    Pain Type Chronic pain                               [facility] Adult PT Treatment/Exercise - 01/05/21 0001       Knee/Hip Exercises: Stretches   Lobbyist Right;Left;2 reps;30 seconds    Other Knee/Hip Stretches quad STM with foam roller in prone      Knee/Hip Exercises: Aerobic   Stationary Bike L2 x 5 min      Knee/Hip Exercises: Supine   Straight Leg Raises  Strengthening;Both;10 reps;2 sets      Knee/Hip Exercises: Prone   Hamstring Curl 2 sets;10 reps   progress to machines next visit   Hamstring Curl Limitations 7.5# bil      Manual Therapy   Manual Therapy Soft tissue mobilization    Manual therapy comments skilled palpation and monitoring of soft tissue during DN    Soft tissue mobilization to right quads              Trigger Point Dry Needling - 01/05/21 0001     Consent Given? Yes    Education Handout Provided Yes    Muscles Treated Lower Quadrant Quadriceps;Vastus lateralis;Vastus medialis    Other Dry Needling right    Quadriceps Response Twitch response elicited;Palpable increased muscle length    Vastus lateralis Response Twitch response elicited;Palpable increased muscle length    Vastus medialis Response Twitch response elicited;Palpable increased muscle length  PT Education - 01/05/21 0950     Education Details DN education and aftercare emailed to pt    Person(s) Educated Patient    Methods Explanation;Demonstration;Handout    Comprehension Verbalized understanding;Returned demonstration                 PT Long Term Goals - 12/28/20 1204       PT LONG TERM GOAL #1   Title Pt will be independent with advanced HEP    Time 6    Period Weeks    Status New    Target Date 02/08/21      PT LONG TERM GOAL #2   Title Pt will be able to ascend/descend steps carrying at least 20# with </=2/10 pain    Baseline 8/10 pain at worst    Time 6    Period Weeks    Status New    Target Date 02/08/21      PT LONG TERM GOAL #3   Title Pt will have R = L knee ROM    Baseline R knee 5 to 105 deg; L knee -3 to 120    Time 6    Period Weeks    Status New    Target Date 02/08/21      PT LONG TERM GOAL #4   Title Pt will be able to walk 2 laps around her neighborhood per her PLOF    Baseline Only tolerates 1 lap currently    Time 6    Period Weeks    Status New    Target Date  02/08/21      PT LONG TERM GOAL #5   Title Pt will have improved FOTO score to at least 62    Baseline 50    Time 6    Period Weeks    Status New    Target Date 02/08/21                   Plan - 01/05/21 0759     Clinical Impression Statement Patient presents today with no sigificant pain increase after just being seen yesterday. Stairs still 7-8/10 pain with descent this morning. Initial trial of DN to right quadriceps today with good response. More intense than pt expected so we held off doing left quad today. She may want next visit. Heat applied post DN/manual therapy. Continue with eccentric quad strength and progress to machines for strengthening as tolerated.    Personal Factors and Comorbidities Age;Fitness;Time since onset of injury/illness/exacerbation    Examination-Activity Limitations Locomotion Level;Transfers;Stairs;Squat;Caring for Others;Carry;Lift    Examination-Participation Restrictions Community Activity;Shop    PT Treatment/Interventions ADLs/Self Care Home Management;Aquatic Therapy;Cryotherapy;Electrical Stimulation;Iontophoresis 4mg /ml Dexamethasone;Gait training;Stair training;Functional mobility training;Therapeutic activities;Therapeutic exercise;Balance training;Neuromuscular re-education;Manual techniques;Patient/family education;Dry needling;Taping    PT Next Visit Plan assess DN/manual to right quads and progress to left if good with pt.  L hip strengthening; bilat quad strengthening/eccentric control. Work on steps. Machine strengthening (pt has gym membership and could benefit from education).    PT Home Exercise Plan Access Code    Consulted and Agree with Plan of Care Patient             Patient will benefit from skilled therapeutic intervention in order to improve the following deficits and impairments:  Decreased range of motion, Difficulty walking, Increased fascial restricitons, Pain, Decreased mobility, Decreased strength,  Improper body mechanics, Decreased activity tolerance  Visit Diagnosis: Chronic pain of right knee  Stiffness of right knee, not elsewhere classified  Chronic pain of left knee  Muscle weakness (generalized)  Other abnormalities of gait and mobility     Problem List Patient Active Problem List   Diagnosis Date Noted   History of mononucleosis 05/09/2017   Depression 05/09/2017   OSA on CPAP 05/09/2017   Chronic fatigue 05/09/2017   Hypothyroidism due to Hashimoto's thyroiditis 05/09/2017   Class 2 severe obesity with serious comorbidity and body mass index (BMI) of 37.0 to 37.9 in adult (HCC) 05/09/2017   Pain in joint, ankle and foot 07/10/2013   Excessive sleepiness 07/03/2012   Goiter 07/03/2012   Visit for preventive health examination 07/03/2012   Plantar fasciitis of right foot 04/16/2012   Foreign travel 04/09/2012   OTHER SPECIFIED DISORDER OF SKIN 01/28/2010   CARPAL TUNNEL SYNDROME, RIGHT 10/22/2009   IRON DEFICIENCY 07/28/2009   DIARRHEA 12/31/2008   OTHER SPECIFIED DISORDER OF RECTUM AND ANUS 07/24/2008   PRURITUS, ANAL 07/24/2008   SHOULDER PAIN, LEFT 01/24/2008   OBESITY 04/19/2007   LOW BACK PAIN, CHRONIC 03/13/2007   OBSTRUCTIVE SLEEP APNEA 02/26/2007   FATIGUE 01/04/2007   SYMPTOM, ENLARGEMENT, LYMPH NODES 12/20/2006   INTERTRIGO, CANDIDAL 11/16/2006   ABNORMAL RESULT, FUNCTION STUDY, THYROID 11/16/2006   DEPRESSION 09/06/2006   ALLERGIC RHINITIS 09/06/2006   ENDOMETRIOSIS NOS 09/06/2006   Solon Palm, PT 01/05/2021, 9:55 AM  [facility] [address] [place], [place], [address] Phone: [phone]   Fax:  [phone]  Name: Maria Terrell MRN: [id number] Date of Birth: 1965-11-05

## 2021-01-05 NOTE — Patient Instructions (Signed)

## 2021-01-11 ENCOUNTER — Other Ambulatory Visit: Payer: Self-pay

## 2021-01-11 ENCOUNTER — Ambulatory Visit: Payer: 59 | Admitting: Physical Therapy

## 2021-01-11 DIAGNOSIS — M25561 Pain in right knee: Secondary | ICD-10-CM | POA: Diagnosis not present

## 2021-01-11 DIAGNOSIS — G8929 Other chronic pain: Secondary | ICD-10-CM

## 2021-01-11 DIAGNOSIS — M6281 Muscle weakness (generalized): Secondary | ICD-10-CM

## 2021-01-11 DIAGNOSIS — R2689 Other abnormalities of gait and mobility: Secondary | ICD-10-CM

## 2021-01-11 DIAGNOSIS — M25661 Stiffness of right knee, not elsewhere classified: Secondary | ICD-10-CM

## 2021-01-11 NOTE — Therapy (Signed)
[facility] [address] [place], [place], [address] Phone: [phone]   Fax:  [phone]  Physical Therapy Treatment  Patient Details  Name: Maria Terrell MRN: [id number] Date of Birth: [birth year] Referring Provider (PT): Arther Abbott PA   Encounter Date: 01/11/2021   PT End of Session - 01/11/21 1320     Visit Number 4    Number of Visits 12    Date for PT Re-Evaluation 02/08/21    Authorization Type [employer]    PT Start Time 1317    PT Stop Time 1400    PT Time Calculation (min) 43 min    Activity Tolerance Patient tolerated treatment well    Behavior During Therapy [facility] for tasks assessed/performed             Past Medical History:  Diagnosis Date   Allergy    Constipation    after gastric surgery having issues    Depression    Endometriosis    Sleep apnea    wears cpap   Thyroid goiter     Past Surgical History:  Procedure Laterality Date   BIOPSY THYROID     BREAST BIOPSY Left    BREAST BIOPSY Right    CHOLECYSTECTOMY  1999   COLONOSCOPY     15 yrs ago   LAPAROSCOPIC GASTRIC BAND REMOVAL WITH LAPAROSCOPIC GASTRIC SLEEVE RESECTION  12/2014   LAPAROTOMY      There were no vitals filed for this visit.   Subjective Assessment - 01/11/21 1321     Subjective Pt states she's been doing her exercises and they've been going okay. "I like doing the stretching." Reports she was sore after DN but feels that her quad is back to it's normal resting tone.    Limitations Standing;Walking;House hold activities    How long can you sit comfortably? n/a    How long can you stand comfortably? n/a more limited by back    How long can you walk comfortably? ~20 minutes (lap around neighborhood; usually able to do 2 laps)    Patient Stated Goals Improve pain and work on strength    Currently in Pain? Yes    Pain Score 3     Pain Location Knee    Pain Orientation Right;Left    Pain Descriptors / Indicators  Sore                               [facility] Adult PT Treatment/Exercise - 01/11/21 0001       Knee/Hip Exercises: Stretches   Lobbyist Right;Left;2 reps;30 seconds    Quad Stretch Limitations prone      Knee/Hip Exercises: Aerobic   Stationary Bike L2 x 5 min      Knee/Hip Exercises: Machines for Strengthening   Cybex Knee Extension 35# 2x10    Cybex Knee Flexion 25# x10; 35# x10      Knee/Hip Exercises: Standing   Other Standing Knee Exercises lateral band walks 2x10      Knee/Hip Exercises: Seated   Other Seated Knee/Hip Exercises straight leg raise with red tband 2x10    Sit to Sand 2 sets;10 reps;without UE support   eccentrics     Knee/Hip Exercises: Sidelying   Hip ABduction Strengthening;Right;Left;2 sets;10 reps                          PT Long  Term Goals - 12/28/20 1204       PT LONG TERM GOAL #1   Title Pt will be independent with advanced HEP    Time 6    Period Weeks    Status New    Target Date 02/08/21      PT LONG TERM GOAL #2   Title Pt will be able to ascend/descend steps carrying at least 20# with </=2/10 pain    Baseline 8/10 pain at worst    Time 6    Period Weeks    Status New    Target Date 02/08/21      PT LONG TERM GOAL #3   Title Pt will have R = L knee ROM    Baseline R knee 5 to 105 deg; L knee -3 to 120    Time 6    Period Weeks    Status New    Target Date 02/08/21      PT LONG TERM GOAL #4   Title Pt will be able to walk 2 laps around her neighborhood per her PLOF    Baseline Only tolerates 1 lap currently    Time 6    Period Weeks    Status New    Target Date 02/08/21      PT LONG TERM GOAL #5   Title Pt will have improved FOTO score to at least 62    Baseline 50    Time 6    Period Weeks    Status New    Target Date 02/08/21                   Plan - 01/11/21 1354     Clinical Impression Statement Treatment focused on progressing pt's HEP. Worked with machine  strengthening this session. Pt did not feel she needed DN this session. Initiated eccentric sit to stands and standing exercises with good pt tolerance.    Personal Factors and Comorbidities Age;Fitness;Time since onset of injury/illness/exacerbation    Examination-Activity Limitations Locomotion Level;Transfers;Stairs;Squat;Caring for Others;Carry;Lift    Examination-Participation Restrictions Community Activity;Shop    PT Treatment/Interventions ADLs/Self Care Home Management;Aquatic Therapy;Cryotherapy;Electrical Stimulation;Iontophoresis 4mg /ml Dexamethasone;Gait training;Stair training;Functional mobility training;Therapeutic activities;Therapeutic exercise;Balance training;Neuromuscular re-education;Manual techniques;Patient/family education;Dry needling;Taping    PT Next Visit Plan assess DN/manual to right quads and progress to left if good with pt.  L hip strengthening; bilat quad strengthening/eccentric control. Work on steps. Machine strengthening (pt has gym membership and could benefit from education).    PT Home Exercise Plan Access Code    Consulted and Agree with Plan of Care Patient             Patient will benefit from skilled therapeutic intervention in order to improve the following deficits and impairments:  Decreased range of motion, Difficulty walking, Increased fascial restricitons, Pain, Decreased mobility, Decreased strength, Improper body mechanics, Decreased activity tolerance  Visit Diagnosis: Chronic pain of right knee  Stiffness of right knee, not elsewhere classified  Chronic pain of left knee  Muscle weakness (generalized)  Other abnormalities of gait and mobility     Problem List Patient Active Problem List   Diagnosis Date Noted   History of mononucleosis 05/09/2017   Depression 05/09/2017   OSA on CPAP 05/09/2017   Chronic fatigue 05/09/2017   Hypothyroidism due to Hashimoto's thyroiditis 05/09/2017   Class 2 severe obesity with  serious comorbidity and body mass index (BMI) of 37.0 to 37.9 in adult (HCC) 05/09/2017   Pain in joint, ankle and  foot 07/10/2013   Excessive sleepiness 07/03/2012   Goiter 07/03/2012   Visit for preventive health examination 07/03/2012   Plantar fasciitis of right foot 04/16/2012   Foreign travel 04/09/2012   OTHER SPECIFIED DISORDER OF SKIN 01/28/2010   CARPAL TUNNEL SYNDROME, RIGHT 10/22/2009   IRON DEFICIENCY 07/28/2009   DIARRHEA 12/31/2008   OTHER SPECIFIED DISORDER OF RECTUM AND ANUS 07/24/2008   PRURITUS, ANAL 07/24/2008   SHOULDER PAIN, LEFT 01/24/2008   OBESITY 04/19/2007   LOW BACK PAIN, CHRONIC 03/13/2007   OBSTRUCTIVE SLEEP APNEA 02/26/2007   FATIGUE 01/04/2007   SYMPTOM, ENLARGEMENT, LYMPH NODES 12/20/2006   INTERTRIGO, CANDIDAL 11/16/2006   ABNORMAL RESULT, FUNCTION STUDY, THYROID 11/16/2006   DEPRESSION 09/06/2006   ALLERGIC RHINITIS 09/06/2006   ENDOMETRIOSIS NOS 09/06/2006    Aylana Hirschfeld April Dell Ponto, PT, DPT 01/11/2021, 2:09 PM  [facility] [address] [place], [place], [address] Phone: [phone]   Fax:  [phone]  Name: Maria Terrell MRN: [id number] Date of Birth: 05/19/1965

## 2021-01-13 ENCOUNTER — Ambulatory Visit: Payer: 59 | Admitting: Physical Therapy

## 2021-01-13 ENCOUNTER — Other Ambulatory Visit: Payer: Self-pay

## 2021-01-13 ENCOUNTER — Encounter: Payer: Self-pay | Admitting: Physical Therapy

## 2021-01-13 DIAGNOSIS — M6281 Muscle weakness (generalized): Secondary | ICD-10-CM

## 2021-01-13 DIAGNOSIS — M25561 Pain in right knee: Secondary | ICD-10-CM

## 2021-01-13 DIAGNOSIS — M25661 Stiffness of right knee, not elsewhere classified: Secondary | ICD-10-CM

## 2021-01-13 DIAGNOSIS — R2689 Other abnormalities of gait and mobility: Secondary | ICD-10-CM

## 2021-01-13 DIAGNOSIS — M25562 Pain in left knee: Secondary | ICD-10-CM

## 2021-01-13 NOTE — Therapy (Signed)
[facility] [address] [place], [place], [address] Phone: [phone]   Fax:  [phone]  Physical Therapy Treatment  Patient Details  Name: Maria Terrell MRN: [id number] Date of Birth: 05/03/1965 Referring Provider (PT): Arther Abbott PA   Encounter Date: 01/13/2021   PT End of Session - 01/13/21 1618     Visit Number 5    Number of Visits 12    Date for PT Re-Evaluation 02/08/21    Authorization Type [employer]    PT Start Time 1536    PT Stop Time 1614    PT Time Calculation (min) 38 min    Activity Tolerance Patient tolerated treatment well    Behavior During Therapy [facility] for tasks assessed/performed             Past Medical History:  Diagnosis Date   Allergy    Constipation    after gastric surgery having issues    Depression    Endometriosis    Sleep apnea    wears cpap   Thyroid goiter     Past Surgical History:  Procedure Laterality Date   BIOPSY THYROID     BREAST BIOPSY Left    BREAST BIOPSY Right    CHOLECYSTECTOMY  1999   COLONOSCOPY     15 yrs ago   LAPAROSCOPIC GASTRIC BAND REMOVAL WITH LAPAROSCOPIC GASTRIC SLEEVE RESECTION  12/2014   LAPAROTOMY      There were no vitals filed for this visit.   Subjective Assessment - 01/13/21 1536     Subjective My knee feels about the same; I found out I got approval for the gel injections and made an appointment to get that in December. I notice the knee pain a lot when I get in and out of the car and when I'm doing steps. Walking a lot can flare it up too.    Patient Stated Goals Improve pain and work on strength    Currently in Pain? No/denies    Pain Score 2     Pain Location Knee    Pain Orientation Right;Left   R>L                              [facility] Adult PT Treatment/Exercise - 01/13/21 0001       Knee/Hip Exercises: Standing   Lateral Step Up Both;1 set;10 reps   6 inch step   Wall Squat 1 set;15 reps    Wall  Squat Limitations with 5 squat pulse after every 5th squat    Other Standing Knee Exercises forward step ups 1x10 6 inch steps    Other Standing Knee Exercises hip hikes 1x10 B      Knee/Hip Exercises: Supine   Bridges Both;1 set;10 reps    Bridges Limitations 3 second holds, slow lower      Knee/Hip Exercises: Sidelying   Hip ABduction Both;1 set;10 reps      Knee/Hip Exercises: Prone   Hip Extension Both;1 set;10 reps                     PT Education - 01/13/21 1617     Education Details exercise form/purpose, possible DOMS and management strategies    Person(s) Educated Patient    Methods Explanation    Comprehension Verbalized understanding                 PT Long Term Goals -  12/28/20 1204       PT LONG TERM GOAL #1   Title Pt will be independent with advanced HEP    Time 6    Period Weeks    Status New    Target Date 02/08/21      PT LONG TERM GOAL #2   Title Pt will be able to ascend/descend steps carrying at least 20# with </=2/10 pain    Baseline 8/10 pain at worst    Time 6    Period Weeks    Status New    Target Date 02/08/21      PT LONG TERM GOAL #3   Title Pt will have R = L knee ROM    Baseline R knee 5 to 105 deg; L knee -3 to 120    Time 6    Period Weeks    Status New    Target Date 02/08/21      PT LONG TERM GOAL #4   Title Pt will be able to walk 2 laps around her neighborhood per her PLOF    Baseline Only tolerates 1 lap currently    Time 6    Period Weeks    Status New    Target Date 02/08/21      PT LONG TERM GOAL #5   Title Pt will have improved FOTO score to at least 62    Baseline 50    Time 6    Period Weeks    Status New    Target Date 02/08/21                   Plan - 01/13/21 1618     Clinical Impression Statement [facility] arrives today doing well- but does not notice a lot of difference in knee pain as of yet. Continued working on functional strengthening for hip and knee musculature in open  and closed chain positions today- tolerated well and had great response to cues/carryover of from prior sessions. Reports she is getting gel injections next month. Will continue efforts and progress as tolerated.    Personal Factors and Comorbidities Age;Fitness;Time since onset of injury/illness/exacerbation    Examination-Activity Limitations Locomotion Level;Transfers;Stairs;Squat;Caring for Others;Carry;Lift    Examination-Participation Restrictions Community Activity;Shop    Stability/Clinical Decision Making Stable/Uncomplicated    Clinical Decision Making Low    Rehab Potential Good    PT Frequency --   2x/week for 4 weeks, then 1x/week for 2 weeks   PT Duration 6 weeks    PT Treatment/Interventions ADLs/Self Care Home Management;Aquatic Therapy;Cryotherapy;Electrical Stimulation;Iontophoresis 4mg /ml Dexamethasone;Gait training;Stair training;Functional mobility training;Therapeutic activities;Therapeutic exercise;Balance training;Neuromuscular re-education;Manual techniques;Patient/family education;Dry needling;Taping    PT Next Visit Plan assess DN/manual to right quads and progress to left if good with pt.  L hip strengthening; bilat quad strengthening/eccentric control. Work on steps. Machine strengthening (pt has gym membership and could benefit from education).    PT Home Exercise Plan Access Code    Consulted and Agree with Plan of Care Patient             Patient will benefit from skilled therapeutic intervention in order to improve the following deficits and impairments:  Decreased range of motion, Difficulty walking, Increased fascial restricitons, Pain, Decreased mobility, Decreased strength, Improper body mechanics, Decreased activity tolerance  Visit Diagnosis: Chronic pain of right knee  Stiffness of right knee, not elsewhere classified  Chronic pain of left knee  Muscle weakness (generalized)  Other abnormalities of gait and mobility  Problem  List Patient Active Problem List   Diagnosis Date Noted   History of mononucleosis 05/09/2017   Depression 05/09/2017   OSA on CPAP 05/09/2017   Chronic fatigue 05/09/2017   Hypothyroidism due to Hashimoto's thyroiditis 05/09/2017   Class 2 severe obesity with serious comorbidity and body mass index (BMI) of 37.0 to 37.9 in adult (HCC) 05/09/2017   Pain in joint, ankle and foot 07/10/2013   Excessive sleepiness 07/03/2012   Goiter 07/03/2012   Visit for preventive health examination 07/03/2012   Plantar fasciitis of right foot 04/16/2012   Foreign travel 04/09/2012   OTHER SPECIFIED DISORDER OF SKIN 01/28/2010   CARPAL TUNNEL SYNDROME, RIGHT 10/22/2009   IRON DEFICIENCY 07/28/2009   DIARRHEA 12/31/2008   OTHER SPECIFIED DISORDER OF RECTUM AND ANUS 07/24/2008   PRURITUS, ANAL 07/24/2008   SHOULDER PAIN, LEFT 01/24/2008   OBESITY 04/19/2007   LOW BACK PAIN, CHRONIC 03/13/2007   OBSTRUCTIVE SLEEP APNEA 02/26/2007   FATIGUE 01/04/2007   SYMPTOM, ENLARGEMENT, LYMPH NODES 12/20/2006   INTERTRIGO, CANDIDAL 11/16/2006   ABNORMAL RESULT, FUNCTION STUDY, THYROID 11/16/2006   DEPRESSION 09/06/2006   ALLERGIC RHINITIS 09/06/2006   ENDOMETRIOSIS NOS 09/06/2006   Lerry Liner PT, DPT, PN2   Supplemental Physical Therapist [place]    Pager [phone] Acute Rehab Office [phone]   [facility] [place] [address] [place], [place], [address] Phone: [phone]   Fax:  [phone]  Name: REDITH DRACH MRN: [id number] Date of Birth: 1965-05-04

## 2021-01-17 ENCOUNTER — Encounter: Payer: Self-pay | Admitting: Physical Therapy

## 2021-01-17 ENCOUNTER — Other Ambulatory Visit: Payer: Self-pay

## 2021-01-17 ENCOUNTER — Ambulatory Visit: Payer: 59 | Admitting: Physical Therapy

## 2021-01-17 DIAGNOSIS — M25561 Pain in right knee: Secondary | ICD-10-CM | POA: Diagnosis not present

## 2021-01-17 DIAGNOSIS — M25661 Stiffness of right knee, not elsewhere classified: Secondary | ICD-10-CM

## 2021-01-17 DIAGNOSIS — G8929 Other chronic pain: Secondary | ICD-10-CM

## 2021-01-17 DIAGNOSIS — R2689 Other abnormalities of gait and mobility: Secondary | ICD-10-CM

## 2021-01-17 DIAGNOSIS — M6281 Muscle weakness (generalized): Secondary | ICD-10-CM

## 2021-01-17 NOTE — Therapy (Signed)
[facility] [address] [place], [place], [address] Phone: [phone]   Fax:  [phone]  Physical Therapy Treatment  Patient Details  Name: Maria Terrell MRN: [id number] Date of Birth: [birth year] Referring Provider (PT): Arther Abbott PA   Encounter Date: 01/17/2021   PT End of Session - 01/17/21 1702     Visit Number 6    Number of Visits 12    Date for PT Re-Evaluation 02/08/21    Authorization Type [employer]    PT Start Time 1617    PT Stop Time 1658    PT Time Calculation (min) 41 min    Activity Tolerance Patient tolerated treatment well    Behavior During Therapy [facility] for tasks assessed/performed             Past Medical History:  Diagnosis Date   Allergy    Constipation    after gastric surgery having issues    Depression    Endometriosis    Sleep apnea    wears cpap   Thyroid goiter     Past Surgical History:  Procedure Laterality Date   BIOPSY THYROID     BREAST BIOPSY Left    BREAST BIOPSY Right    CHOLECYSTECTOMY  1999   COLONOSCOPY     15 yrs ago   LAPAROSCOPIC GASTRIC BAND REMOVAL WITH LAPAROSCOPIC GASTRIC SLEEVE RESECTION  12/2014   LAPAROTOMY      There were no vitals filed for this visit.   Subjective Assessment - 01/17/21 1618     Subjective This weekend I was at a restaurant and had to go up/down some steps; for a minute it felt like there was a band in my knee that moved. It was right at the knee, its hard to describe.    Limitations Standing;Walking;House hold activities    How long can you sit comfortably? n/a    Patient Stated Goals Improve pain and work on strength    Currently in Pain? Yes    Pain Score 1     Pain Location Knee    Pain Orientation Right    Pain Descriptors / Indicators Sore                               [facility] Adult PT Treatment/Exercise - 01/17/21 0001       Knee/Hip Exercises: Standing   Wall Squat 1 set;20 reps    Wall  Squat Limitations with 5 squat pulse after every 5th squat    Other Standing Knee Exercises forward step ups 1x10 B 8 inch box    Other Standing Knee Exercises hip hikes 2x15 B with and without swing; 3D hip excursions 1x10 B all ways      Knee/Hip Exercises: Supine   Bridges Both;1 set;15 reps    Bridges Limitations 3 second holds, slow lower    Other Supine Knee/Hip Exercises bridge with clam into red TB 1x10      Knee/Hip Exercises: Sidelying   Hip ABduction Both;1 set;15 reps      Knee/Hip Exercises: Prone   Hip Extension Both;1 set;15 reps                     PT Education - 01/17/21 1702     Education Details possible DOMS, may need to revisit DN    Person(s) Educated Patient    Methods Explanation    Comprehension Verbalized understanding  PT Long Term Goals - 12/28/20 1204       PT LONG TERM GOAL #1   Title Pt will be independent with advanced HEP    Time 6    Period Weeks    Status New    Target Date 02/08/21      PT LONG TERM GOAL #2   Title Pt will be able to ascend/descend steps carrying at least 20# with </=2/10 pain    Baseline 8/10 pain at worst    Time 6    Period Weeks    Status New    Target Date 02/08/21      PT LONG TERM GOAL #3   Title Pt will have R = L knee ROM    Baseline R knee 5 to 105 deg; L knee -3 to 120    Time 6    Period Weeks    Status New    Target Date 02/08/21      PT LONG TERM GOAL #4   Title Pt will be able to walk 2 laps around her neighborhood per her PLOF    Baseline Only tolerates 1 lap currently    Time 6    Period Weeks    Status New    Target Date 02/08/21      PT LONG TERM GOAL #5   Title Pt will have improved FOTO score to at least 62    Baseline 50    Time 6    Period Weeks    Status New    Target Date 02/08/21                   Plan - 01/17/21 1702     Clinical Impression Statement [name] arrives today feeling well- was sore after last session but felt it  was productive. Continued to spend time today focusing on hip, knee, and general closed chain strengthening. Did much better with exercise form today. Tells me she had some trouble with what sounds to be a potential muscle spasm over the weekend- might benefit from revisiting DN with certified clinician next visit.    Personal Factors and Comorbidities Age;Fitness;Time since onset of injury/illness/exacerbation    Examination-Activity Limitations Locomotion Level;Transfers;Stairs;Squat;Caring for Others;Carry;Lift    Examination-Participation Restrictions Community Activity;Shop    Stability/Clinical Decision Making Stable/Uncomplicated    Clinical Decision Making Low    Rehab Potential Good    PT Frequency --   2x/week for 4 weeks, then 1x/week for 2 weeks   PT Duration 6 weeks    PT Treatment/Interventions ADLs/Self Care Home Management;Aquatic Therapy;Cryotherapy;Electrical Stimulation;Iontophoresis 4mg /ml Dexamethasone;Gait training;Stair training;Functional mobility training;Therapeutic activities;Therapeutic exercise;Balance training;Neuromuscular re-education;Manual techniques;Patient/family education;Dry needling;Taping    PT Next Visit Plan assess DN/manual to right quads and progress to left if good with pt.  L hip strengthening; bilat quad strengthening/eccentric control. Work on steps. Machine strengthening (pt has gym membership and could benefit from education).    PT Home Exercise Plan Access Code    Consulted and Agree with Plan of Care Patient             Patient will benefit from skilled therapeutic intervention in order to improve the following deficits and impairments:  Decreased range of motion, Difficulty walking, Increased fascial restricitons, Pain, Decreased mobility, Decreased strength, Improper body mechanics, Decreased activity tolerance  Visit Diagnosis: Chronic pain of right knee  Stiffness of right knee, not elsewhere classified  Chronic pain of left  knee  Muscle weakness (generalized)  Other abnormalities of  gait and mobility     Problem List Patient Active Problem List   Diagnosis Date Noted   History of mononucleosis 05/09/2017   Depression 05/09/2017   OSA on CPAP 05/09/2017   Chronic fatigue 05/09/2017   Hypothyroidism due to Hashimoto's thyroiditis 05/09/2017   Class 2 severe obesity with serious comorbidity and body mass index (BMI) of 37.0 to 37.9 in adult (HCC) 05/09/2017   Pain in joint, ankle and foot 07/10/2013   Excessive sleepiness 07/03/2012   Goiter 07/03/2012   Visit for preventive health examination 07/03/2012   Plantar fasciitis of right foot 04/16/2012   Foreign travel 04/09/2012   OTHER SPECIFIED DISORDER OF SKIN 01/28/2010   CARPAL TUNNEL SYNDROME, RIGHT 10/22/2009   IRON DEFICIENCY 07/28/2009   DIARRHEA 12/31/2008   OTHER SPECIFIED DISORDER OF RECTUM AND ANUS 07/24/2008   PRURITUS, ANAL 07/24/2008   SHOULDER PAIN, LEFT 01/24/2008   OBESITY 04/19/2007   LOW BACK PAIN, CHRONIC 03/13/2007   OBSTRUCTIVE SLEEP APNEA 02/26/2007   FATIGUE 01/04/2007   SYMPTOM, ENLARGEMENT, LYMPH NODES 12/20/2006   INTERTRIGO, CANDIDAL 11/16/2006   ABNORMAL RESULT, FUNCTION STUDY, THYROID 11/16/2006   DEPRESSION 09/06/2006   ALLERGIC RHINITIS 09/06/2006   ENDOMETRIOSIS NOS 09/06/2006   Lerry Liner PT, DPT, PN2   Supplemental Physical Therapist [place]    Pager [phone] Acute Rehab Office [phone]   [facility] [place] [facility] [address] [place], [place], [address] Phone: [phone]   Fax:  [phone]  Name: Maria Terrell MRN: [id number] Date of Birth: Aug 14, 1965

## 2021-01-19 ENCOUNTER — Other Ambulatory Visit: Payer: Self-pay

## 2021-01-19 ENCOUNTER — Ambulatory Visit: Payer: 59 | Admitting: Physical Therapy

## 2021-01-19 DIAGNOSIS — M25562 Pain in left knee: Secondary | ICD-10-CM

## 2021-01-19 DIAGNOSIS — M25661 Stiffness of right knee, not elsewhere classified: Secondary | ICD-10-CM

## 2021-01-19 DIAGNOSIS — G8929 Other chronic pain: Secondary | ICD-10-CM

## 2021-01-19 DIAGNOSIS — M25561 Pain in right knee: Secondary | ICD-10-CM | POA: Diagnosis not present

## 2021-01-19 DIAGNOSIS — R2689 Other abnormalities of gait and mobility: Secondary | ICD-10-CM

## 2021-01-19 DIAGNOSIS — M6281 Muscle weakness (generalized): Secondary | ICD-10-CM

## 2021-01-19 NOTE — Therapy (Signed)
[facility] [place] [address] [place], [place], [address] Phone: [phone]   Fax:  [phone]  Physical Therapy Treatment  Patient Details  Name: Maria Terrell MRN: [id number] Date of Birth: 12/11/1965 Referring Provider (PT): Arther Abbott PA   Encounter Date: 01/19/2021   PT End of Session - 01/19/21 1316     Visit Number 7    Number of Visits 12    Date for PT Re-Evaluation 02/08/21    Authorization Type [employer]    PT Start Time 1316    PT Stop Time 1400    PT Time Calculation (min) 44 min    Activity Tolerance Patient tolerated treatment well    Behavior During Therapy [facility] for tasks assessed/performed             Past Medical History:  Diagnosis Date   Allergy    Constipation    after gastric surgery having issues    Depression    Endometriosis    Sleep apnea    wears cpap   Thyroid goiter     Past Surgical History:  Procedure Laterality Date   BIOPSY THYROID     BREAST BIOPSY Left    BREAST BIOPSY Right    CHOLECYSTECTOMY  1999   COLONOSCOPY     15 yrs ago   LAPAROSCOPIC GASTRIC BAND REMOVAL WITH LAPAROSCOPIC GASTRIC SLEEVE RESECTION  12/2014   LAPAROTOMY      There were no vitals filed for this visit.   Subjective Assessment - 01/19/21 1316     Subjective Pt reports she doesn't feel like doing TPDN today. Pt notes that she has a general soreness around her R lateral knee. Pt notes she has been doing wall squats at home.    Limitations Standing;Walking;House hold activities    How long can you sit comfortably? n/a    How long can you stand comfortably? n/a more limited by back    How long can you walk comfortably? ~20 minutes (lap around neighborhood; usually able to do 2 laps)    Patient Stated Goals Improve pain and work on strength    Currently in Pain? Yes    Pain Score 1                                [facility] Adult PT Treatment/Exercise - 01/19/21 0001        Self-Care   Self-Care Other Self-Care Comments    Other Self-Care Comments  Completing self myofacial release and trigger point release at home with rolling pin and ball      Knee/Hip Exercises: Stretches   Passive Hamstring Stretch Right;30 seconds    Hip Flexor Stretch 30 seconds    Other Knee/Hip Stretches ITB stretch 2x30 sec (performed in supine and then standing)   attempted sidelying but too painful on pt's other hip     Knee/Hip Exercises: Aerobic   Stationary Bike L4 x 5 min      Knee/Hip Exercises: Machines for Strengthening   Total Gym Leg Press Eccentric DL press 696# & 29# 2x8 each      Knee/Hip Exercises: Sidelying   Hip ABduction Strengthening;Both;20 reps      Manual Therapy   Manual therapy comments patellar mobilization on R medial/lateral glides    Soft tissue mobilization TPR and IASTM to ITB, TFL and vastus lateralis on R    Myofascial Release Self massage and myofacial release  of TFL, quads, glutes/piriformis                     PT Education - 01/19/21 1403     Education Details Discussed ITB tightness, anatomy and pain patterns. Updated HEP    Person(s) Educated Patient    Methods Explanation;Demonstration;Tactile cues;Verbal cues;Handout    Comprehension Verbalized understanding;Returned demonstration;Verbal cues required;Tactile cues required                 PT Long Term Goals - 12/28/20 1204       PT LONG TERM GOAL #1   Title Pt will be independent with advanced HEP    Time 6    Period Weeks    Status New    Target Date 02/08/21      PT LONG TERM GOAL #2   Title Pt will be able to ascend/descend steps carrying at least 20# with </=2/10 pain    Baseline 8/10 pain at worst    Time 6    Period Weeks    Status New    Target Date 02/08/21      PT LONG TERM GOAL #3   Title Pt will have R = L knee ROM    Baseline R knee 5 to 105 deg; L knee -3 to 120    Time 6    Period Weeks    Status New    Target Date 02/08/21      PT  LONG TERM GOAL #4   Title Pt will be able to walk 2 laps around her neighborhood per her PLOF    Baseline Only tolerates 1 lap currently    Time 6    Period Weeks    Status New    Target Date 02/08/21      PT LONG TERM GOAL #5   Title Pt will have improved FOTO score to at least 62    Baseline 50    Time 6    Period Weeks    Status New    Target Date 02/08/21                   Plan - 01/19/21 1403     Clinical Impression Statement Pt reports some soreness/tightness around her lateral knee. Provided patellar mobilization with very taut medial glide. Found vastus lateralis, ITB and TFL trigger points addressed with manual therapy this session. Provided pt ITB stretches as this may be a factor to her continued R knee ache/pain with knee flex/extension during steps and car transfers. Continued to work on eccentric strengthening of knees and hips. Pt with improved SLR with decreased extensor lag.    Personal Factors and Comorbidities Age;Fitness;Time since onset of injury/illness/exacerbation    Examination-Activity Limitations Locomotion Level;Transfers;Stairs;Squat;Caring for Others;Carry;Lift    Examination-Participation Restrictions Community Activity;Shop    Stability/Clinical Decision Making Stable/Uncomplicated    Rehab Potential Good    PT Frequency --   2x/week for 4 weeks, then 1x/week for 2 weeks   PT Duration 6 weeks    PT Treatment/Interventions ADLs/Self Care Home Management;Aquatic Therapy;Cryotherapy;Electrical Stimulation;Iontophoresis 4mg /ml Dexamethasone;Gait training;Stair training;Functional mobility training;Therapeutic activities;Therapeutic exercise;Balance training;Neuromuscular re-education;Manual techniques;Patient/family education;Dry needling;Taping    PT Next Visit Plan Manual therapy & TPR of R quad/ITB/hip flexors. L hip strengthening; bilat quad strengthening/eccentric control. Work on steps. Machine strengthening (pt has gym membership and could  benefit from education).    PT Home Exercise Plan Access Code    Consulted and Agree with Plan of Care Patient  Patient will benefit from skilled therapeutic intervention in order to improve the following deficits and impairments:  Decreased range of motion, Difficulty walking, Increased fascial restricitons, Pain, Decreased mobility, Decreased strength, Improper body mechanics, Decreased activity tolerance  Visit Diagnosis: Chronic pain of right knee  Stiffness of right knee, not elsewhere classified  Chronic pain of left knee  Muscle weakness (generalized)  Other abnormalities of gait and mobility     Problem List Patient Active Problem List   Diagnosis Date Noted   History of mononucleosis 05/09/2017   Depression 05/09/2017   OSA on CPAP 05/09/2017   Chronic fatigue 05/09/2017   Hypothyroidism due to Hashimoto's thyroiditis 05/09/2017   Class 2 severe obesity with serious comorbidity and body mass index (BMI) of 37.0 to 37.9 in adult (HCC) 05/09/2017   Pain in joint, ankle and foot 07/10/2013   Excessive sleepiness 07/03/2012   Goiter 07/03/2012   Visit for preventive health examination 07/03/2012   Plantar fasciitis of right foot 04/16/2012   Foreign travel 04/09/2012   OTHER SPECIFIED DISORDER OF SKIN 01/28/2010   CARPAL TUNNEL SYNDROME, RIGHT 10/22/2009   IRON DEFICIENCY 07/28/2009   DIARRHEA 12/31/2008   OTHER SPECIFIED DISORDER OF RECTUM AND ANUS 07/24/2008   PRURITUS, ANAL 07/24/2008   SHOULDER PAIN, LEFT 01/24/2008   OBESITY 04/19/2007   LOW BACK PAIN, CHRONIC 03/13/2007   OBSTRUCTIVE SLEEP APNEA 02/26/2007   FATIGUE 01/04/2007   SYMPTOM, ENLARGEMENT, LYMPH NODES 12/20/2006   INTERTRIGO, CANDIDAL 11/16/2006   ABNORMAL RESULT, FUNCTION STUDY, THYROID 11/16/2006   DEPRESSION 09/06/2006   ALLERGIC RHINITIS 09/06/2006   ENDOMETRIOSIS NOS 09/06/2006    Syniah Berne April Dell Ponto, PT, DPT 01/19/2021, 2:13 PM  [facility] [place] [facility] [address] [place], [place], [address] Phone: [phone]   Fax:  [phone]  Name: Maria Terrell MRN: [id number] Date of Birth: 08-15-1965

## 2021-01-25 ENCOUNTER — Ambulatory Visit: Payer: 59 | Admitting: Physical Therapy

## 2021-01-25 ENCOUNTER — Encounter: Payer: Self-pay | Admitting: Physical Therapy

## 2021-01-25 ENCOUNTER — Other Ambulatory Visit: Payer: Self-pay

## 2021-01-25 DIAGNOSIS — M25661 Stiffness of right knee, not elsewhere classified: Secondary | ICD-10-CM

## 2021-01-25 DIAGNOSIS — M25562 Pain in left knee: Secondary | ICD-10-CM

## 2021-01-25 DIAGNOSIS — G8929 Other chronic pain: Secondary | ICD-10-CM

## 2021-01-25 DIAGNOSIS — M25561 Pain in right knee: Secondary | ICD-10-CM | POA: Diagnosis not present

## 2021-01-25 DIAGNOSIS — R2689 Other abnormalities of gait and mobility: Secondary | ICD-10-CM

## 2021-01-25 DIAGNOSIS — M6281 Muscle weakness (generalized): Secondary | ICD-10-CM

## 2021-01-25 NOTE — Therapy (Signed)
[facility] [address] , [place], [address] Phone: [phone]   Fax:  [phone]  Physical Therapy Treatment  Patient Details  Name: Maria Terrell MRN: [id number] Date of Birth: 06/05/1965 Referring Provider (PT): Kristie Edmisten PA   Encounter Date: 01/25/2021   PT End of Session - 01/25/21 1808     Visit Number 8    Number of Visits 12    Date for PT Re-Evaluation 02/08/21    Authorization Type [employer]    PT Start Time 1714    PT Stop Time 1805    PT Time Calculation (min) 51 min    Activity Tolerance Patient tolerated treatment well    Behavior During Therapy WFL for tasks assessed/performed             Past Medical History:  Diagnosis Date   Allergy    Constipation    after gastric surgery having issues    Depression    Endometriosis    Sleep apnea    wears cpap   Thyroid goiter     Past Surgical History:  Procedure Laterality Date   BIOPSY THYROID     BREAST BIOPSY Left    BREAST BIOPSY Right    CHOLECYSTECTOMY  1999   COLONOSCOPY     15 yrs ago   LAPAROSCOPIC GASTRIC BAND REMOVAL WITH LAPAROSCOPIC GASTRIC SLEEVE RESECTION  12/2014   LAPAROTOMY      There were no vitals filed for this visit.   Subjective Assessment - 01/25/21 1722     Subjective I have the injections set up starting December 14th.  I hve one more appointment and really want to know what I can do.    Currently in Pain? Yes    Pain Score 1     Pain Location Knee    Pain Orientation Right    Pain Descriptors / Indicators Sore    Aggravating Factors  bending, stairs                               [facility] Adult PT Treatment/Exercise - 01/25/21 0001       Knee/Hip Exercises: Aerobic   Stationary Bike L3 x 5 min, showed her the power burst    Elliptical level 1 x 1.5 minutes    Nustep level 5 x 5 minutes      Knee/Hip Exercises: Machines for Strengthening   Cybex Knee Extension 10# 2x10     Cybex Knee Flexion 35# 2x10    Cybex Leg Press 40# smaller ROM 2x10      Knee/Hip Exercises: Supine   Other Supine Knee/Hip Exercises feet on ball K2C, small rotations, bridges and isometric abs                     PT Education - 01/25/21 1807     Education Details Gave handout of ball exercises and a form with the machines and the weights and reps for her to go to the gym, also talked with her about the form,    Person(s) Educated Patient    Methods Explanation;Demonstration;Tactile cues;Verbal cues;Handout    Comprehension Verbalized understanding;Returned demonstration;Verbal cues required;Tactile cues required                 PT Long Term Goals - 01/25/21 1812       PT LONG TERM GOAL #1   Title Pt will be independent with   advanced HEP    Status Partially Met      PT LONG TERM GOAL #2   Title Pt will be able to ascend/descend steps carrying at least 20# with </=2/10 pain    Status Partially Met      PT LONG TERM GOAL #3   Title Pt will have R = L knee ROM    Status Partially Met      PT LONG TERM GOAL #4   Title Pt will be able to walk 2 laps around her neighborhood per her PLOF    Status Partially Met      PT LONG TERM GOAL #5   Title Pt will have improved FOTO score to at least 62    Status Partially Met                   Plan - 01/25/21 1809     Clinical Impression Statement Patient is scheduled for the gel knee injections in Mid December.  She has one more appointment with [place], today I spoke with her about gym activities and her plan, she is going to a small gym and wnats to assure safety and what is good for her health and knees.  I went over gym exercises and the machines and adjustments and performed iwth her, I asked her to take pix of her gym so we can ssure that she has all the education needed to do safely on her own.  I added a lot of things today, see how she responds    PT Next Visit Plan if she brings in pix of her gym we will  help her assure her safety with the equipment, how to adjust and what it is for, answer her questions about the gym and her exercises.  FOTO and D/C    Consulted and Agree with Plan of Care Patient             Patient will benefit from skilled therapeutic intervention in order to improve the following deficits and impairments:  Decreased range of motion, Difficulty walking, Increased fascial restricitons, Pain, Decreased mobility, Decreased strength, Improper body mechanics, Decreased activity tolerance  Visit Diagnosis: Chronic pain of right knee  Stiffness of right knee, not elsewhere classified  Chronic pain of left knee  Muscle weakness (generalized)  Other abnormalities of gait and mobility     Problem List Patient Active Problem List   Diagnosis Date Noted   History of mononucleosis 05/09/2017   Depression 05/09/2017   OSA on CPAP 05/09/2017   Chronic fatigue 05/09/2017   Hypothyroidism due to Hashimoto's thyroiditis 05/09/2017   Class 2 severe obesity with serious comorbidity and body mass index (BMI) of 37.0 to 37.9 in adult ([place]) 05/09/2017   Pain in joint, ankle and foot 07/10/2013   Excessive sleepiness 07/03/2012   Goiter 07/03/2012   Visit for preventive health examination 07/03/2012   Plantar fasciitis of right foot 04/16/2012   Foreign travel 04/09/2012   OTHER SPECIFIED DISORDER OF SKIN 01/28/2010   CARPAL TUNNEL SYNDROME, RIGHT 10/22/2009   IRON DEFICIENCY 07/28/2009   DIARRHEA 12/31/2008   OTHER SPECIFIED DISORDER OF RECTUM AND ANUS 07/24/2008   PRURITUS, ANAL 07/24/2008   SHOULDER PAIN, LEFT 01/24/2008   OBESITY 04/19/2007   LOW BACK PAIN, CHRONIC 03/13/2007   OBSTRUCTIVE SLEEP APNEA 02/26/2007   FATIGUE 01/04/2007   SYMPTOM, ENLARGEMENT, LYMPH NODES 12/20/2006   INTERTRIGO, CANDIDAL 11/16/2006   ABNORMAL RESULT, FUNCTION STUDY, THYROID 11/16/2006   DEPRESSION 09/06/2006  ALLERGIC RHINITIS 09/06/2006   ENDOMETRIOSIS NOS 09/06/2006     ALBRIGHT,MICHAEL W, PT 01/25/2021, 6:13 PM  [facility] [address] [place], [place], [address] Phone: [phone]   Fax:  [phone]  Name: Aleynah K Gorum MRN: [id number] Date of Birth: 03/29/1965    

## 2021-01-27 ENCOUNTER — Ambulatory Visit: Payer: 59 | Attending: Student | Admitting: Physical Therapy

## 2021-01-27 ENCOUNTER — Encounter: Payer: Self-pay | Admitting: Physical Therapy

## 2021-01-27 ENCOUNTER — Other Ambulatory Visit: Payer: Self-pay

## 2021-01-27 DIAGNOSIS — M25561 Pain in right knee: Secondary | ICD-10-CM | POA: Insufficient documentation

## 2021-01-27 DIAGNOSIS — M6281 Muscle weakness (generalized): Secondary | ICD-10-CM | POA: Diagnosis present

## 2021-01-27 DIAGNOSIS — M25661 Stiffness of right knee, not elsewhere classified: Secondary | ICD-10-CM | POA: Insufficient documentation

## 2021-01-27 DIAGNOSIS — G8929 Other chronic pain: Secondary | ICD-10-CM | POA: Diagnosis present

## 2021-01-27 DIAGNOSIS — M25562 Pain in left knee: Secondary | ICD-10-CM | POA: Diagnosis present

## 2021-01-27 NOTE — Therapy (Signed)
[facility] [place], [place], [address] Phone: [phone]   Fax:  [phone]  Physical Therapy Treatment  Patient Details  Name: Maria Terrell MRN: [id number] Date of Birth: Apr 27, 1965 Referring Provider (PT): Theresa Duty PA   Encounter Date: 01/27/2021   PT End of Session - 01/27/21 1141     Visit Number 9    PT Start Time 1100    PT Stop Time 1884    PT Time Calculation (min) 45 min    Activity Tolerance Patient tolerated treatment well    Behavior During Therapy [facility] for tasks assessed/performed             Past Medical History:  Diagnosis Date   Allergy    Constipation    after gastric surgery having issues    Depression    Endometriosis    Sleep apnea    wears cpap   Thyroid goiter     Past Surgical History:  Procedure Laterality Date   BIOPSY THYROID     BREAST BIOPSY Left    BREAST BIOPSY Right    CHOLECYSTECTOMY  1999   COLONOSCOPY     15 yrs ago   [place]  12/2014   LAPAROTOMY      There were no vitals filed for this visit.   Subjective Assessment - 01/27/21 1101     Subjective "This is my last day" "I am feeling pretty good today"    Currently in Pain? Yes    Pain Score 1     Pain Location Knee    Pain Orientation Right                               [facility] Adult PT Treatment/Exercise - 01/27/21 0001       Knee/Hip Exercises: Aerobic   Stationary Bike L3 x 5 min, showed her the power burst    Elliptical level 1 x 2 minutes      Knee/Hip Exercises: Machines for Strengthening   Cybex Knee Extension 10# 2x12    Cybex Knee Flexion 35# 2x12    Cybex Leg Press 40# 2x10, Heel raises 40lb 2x10      Knee/Hip Exercises: Standing   Forward Step Up Both;1 set;5 reps;Hand Hold: 0;Step Height: 6"    Walking with Sports Cord 30lb side steps x5 each      Knee/Hip Exercises: Seated    Sit to Sand 2 sets;5 reps   LE on airex                         PT Long Term Goals - 01/27/21 1141       PT LONG TERM GOAL #1   Title Pt will be independent with advanced HEP    Status Achieved      PT LONG TERM GOAL #2   Title Pt will be able to ascend/descend steps carrying at least 20# with </=2/10 pain    Status Achieved      PT LONG TERM GOAL #3   Title Pt will have R = L knee ROM    Status Partially Met      PT LONG TERM GOAL #4   Title Pt will be able to walk 2 laps around her neighborhood per her PLOF    Status Achieved  Plan - 01/27/21 1142     Clinical Impression Statement Pt has obtained a gym membership and will continues care on her own. She Increase reps tolerated with seated leg curls and extensions. All questions regarding her activities were answered. Some pressure in the knees reported with sit to stands and step ups. Injections scheduled for mid December.    Personal Factors and Comorbidities Age;Fitness;Time since onset of injury/illness/exacerbation    Examination-Activity Limitations Locomotion Level;Transfers;Stairs;Squat;Caring for Others;Carry;Lift    Examination-Participation Restrictions Community Activity;Shop    Stability/Clinical Decision Making Stable/Uncomplicated    Rehab Potential Good    PT Duration 6 weeks    PT Treatment/Interventions ADLs/Self Care Home Management;Aquatic Therapy;Cryotherapy;Electrical Stimulation;Iontophoresis 6m/ml Dexamethasone;Gait training;Stair training;Functional mobility training;Therapeutic activities;Therapeutic exercise;Balance training;Neuromuscular re-education;Manual techniques;Patient/family education;Dry needling;Taping    PT Next Visit Plan FOTO and D/C             Patient will benefit from skilled therapeutic intervention in order to improve the following deficits and impairments:  Decreased range of motion, Difficulty walking, Increased fascial restricitons,  Pain, Decreased mobility, Decreased strength, Improper body mechanics, Decreased activity tolerance  Visit Diagnosis: Chronic pain of right knee  Stiffness of right knee, not elsewhere classified  Chronic pain of left knee  Muscle weakness (generalized)     Problem List Patient Active Problem List   Diagnosis Date Noted   History of mononucleosis 05/09/2017   Depression 05/09/2017   OSA on CPAP 05/09/2017   Chronic fatigue 05/09/2017   Hypothyroidism due to Hashimoto's thyroiditis 05/09/2017   Class 2 severe obesity with serious comorbidity and body mass index (BMI) of 37.0 to 37.9 in adult ([place] 05/09/2017   Pain in joint, ankle and foot 07/10/2013   Excessive sleepiness 07/03/2012   Goiter 07/03/2012   Visit for preventive health examination 07/03/2012   Plantar fasciitis of right foot 04/16/2012   Foreign travel 04/09/2012   OTHER SPECIFIED DISORDER OF SKIN 01/28/2010   CARPAL TUNNEL SYNDROME, RIGHT 10/22/2009   IRON DEFICIENCY 07/28/2009   DIARRHEA 12/31/2008   OTHER SPECIFIED DISORDER OF RECTUM AND ANUS 07/24/2008   PRURITUS, ANAL 07/24/2008   SHOULDER PAIN, LEFT 01/24/2008   OBESITY 04/19/2007   LOW BACK PAIN, CHRONIC 03/13/2007   OBSTRUCTIVE SLEEP APNEA 02/26/2007   FATIGUE 01/04/2007   SYMPTOM, ENLARGEMENT, LYMPH NODES 12/20/2006   INTERTRIGO, CANDIDAL 11/16/2006   ABNORMAL RESULT, FUNCTION STUDY, THYROID 11/16/2006   DEPRESSION 09/06/2006   ALLERGIC RHINITIS 09/06/2006   ENDOMETRIOSIS NOS 09/06/2006   PHYSICAL THERAPY DISCHARGE SUMMARY  Visits from Start of Care: 9   Patient agrees to discharge. Patient goals were partially met. Patient is being discharged due to being pleased with the current functional level.   RScot Jun PTA 01/27/2021, 11:45 AM  [place] [address]Phone: [phone]  Fax:  [phone] Name: Maria BURKMANMRN: [id number]  of Birth: [birth year]

## 2021-03-28 ENCOUNTER — Institutional Professional Consult (permissible substitution): Payer: PRIVATE HEALTH INSURANCE | Admitting: Neurology

## 2021-04-05 ENCOUNTER — Ambulatory Visit: Payer: 59 | Attending: Student | Admitting: Physical Therapy

## 2021-04-05 ENCOUNTER — Other Ambulatory Visit: Payer: Self-pay

## 2021-04-05 ENCOUNTER — Encounter: Payer: Self-pay | Admitting: Physical Therapy

## 2021-04-05 DIAGNOSIS — M6281 Muscle weakness (generalized): Secondary | ICD-10-CM | POA: Diagnosis present

## 2021-04-05 DIAGNOSIS — M542 Cervicalgia: Secondary | ICD-10-CM | POA: Diagnosis present

## 2021-04-05 DIAGNOSIS — R252 Cramp and spasm: Secondary | ICD-10-CM | POA: Insufficient documentation

## 2021-04-05 DIAGNOSIS — M25561 Pain in right knee: Secondary | ICD-10-CM | POA: Insufficient documentation

## 2021-04-05 DIAGNOSIS — M5412 Radiculopathy, cervical region: Secondary | ICD-10-CM | POA: Insufficient documentation

## 2021-04-05 DIAGNOSIS — G8929 Other chronic pain: Secondary | ICD-10-CM | POA: Insufficient documentation

## 2021-04-05 NOTE — Patient Instructions (Signed)
Access Code: [email or web address] URL: [email or web address] Date: 04/05/2021 Prepared by: Stacie Glaze  Exercises Doorway Pec Stretch at 90 Degrees Abduction - 2 x daily - 7 x weekly - 2 sets - 10 reps - 10 hold Seated Scapular Retraction - 2 x daily - 7 x weekly - 2 sets - 10 reps - 3 hold

## 2021-04-05 NOTE — Therapy (Signed)
[facility] [address] [place], [place], [address] Phone: [phone]   Fax:  [phone]  Physical Therapy Evaluation  Patient Details  Name: Maria Terrell MRN: [id number] Date of Birth: December 03, 1965 Referring Provider (PT): Nicanor Bake   Encounter Date: 04/05/2021   PT End of Session - 04/05/21 1615     Visit Number 1    Date for PT Re-Evaluation 07/03/21    Authorization Type [employer]    PT Start Time 1530    PT Stop Time 1615    PT Time Calculation (min) 45 min    Activity Tolerance Patient tolerated treatment well    Behavior During Therapy [facility] for tasks assessed/performed             Past Medical History:  Diagnosis Date   Allergy    Constipation    after gastric surgery having issues    Depression    Endometriosis    Sleep apnea    wears cpap   Thyroid goiter     Past Surgical History:  Procedure Laterality Date   BIOPSY THYROID     BREAST BIOPSY Left    BREAST BIOPSY Right    CHOLECYSTECTOMY  1999   COLONOSCOPY     15 yrs ago   LAPAROSCOPIC GASTRIC BAND REMOVAL WITH LAPAROSCOPIC GASTRIC SLEEVE RESECTION  12/2014   LAPAROTOMY      There were no vitals filed for this visit.    Subjective Assessment - 04/05/21 1530     Subjective Patient reports htat she has had neck pain about 20 years, has had PT in the past with some benefit, some right arm tingling.  She has known stenosis.  Reports that she has been doing well, reports that recently she has been having some increased pain, stopped working this past year.    Limitations House hold activities;Reading    Patient Stated Goals have less pain, less affects of pain and tingling on my life    Currently in Pain? Yes    Pain Score 0-No pain    Pain Location Neck    Pain Orientation Right    Pain Descriptors / Indicators Tightness;Numbness;Tingling;Aching    Pain Type Chronic pain    Pain Radiating Towards has some right arm numbness and  tingling, has had some in the left hand as well    Pain Onset More than a month ago    Pain Frequency Intermittent    Aggravating Factors  reading, use of the arm, blow drying hair, sleeping pain up to 8/10    Pain Relieving Factors change of posistion, massaging the arm    Effect of Pain on Daily Activities difficulty sleeping, reading, using the right arm                [facility] PT Assessment - 04/05/21 0001       Assessment   Medical Diagnosis cervical stenosis    Referring Provider (PT) Nicanor Bake    Onset Date/Surgical Date 03/05/21    Prior Therapy for knees      Precautions   Precautions None      Restrictions   Weight Bearing Restrictions No      Balance Screen   Has the patient fallen in the past 6 months No    Has the patient had a decrease in activity level because of a fear of falling?  No    Is the patient reluctant to leave their home because of a fear of  falling?  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    Leisure Going to a [employer] does some aerobics and some weights      Posture/Postural Control   Posture Comments slight forward head, rounded shoulders      AROM   Overall AROM Comments cervical ROM decreased 25% for flexion and extension, decresaed 50% for rotation and side bending, shoulder ROM WNL's      Strength   Overall Strength Comments shoulder and elbow 4+/5 with some mild pain in the arms, bilateral grip strength is 65#      Flexibility   Soft Tissue Assessment /Muscle Length --   mild neural tension in the arms     Palpation   Palpation comment she is very tight in the neck and upper traps, tender, has very tender right lateral upper arm and elbow wrist extensors, has a large knot in the scalenes and the SCM                         Objective measurements completed on examination: See above findings.       [facility] Adult PT Treatment/Exercise - 04/05/21 0001       Manual Therapy   Manual Therapy Soft tissue mobilization    Soft tissue mobilization to the right upper trap, SCM and parapsinals                          PT Long Term Goals - 04/05/21 1718       PT LONG TERM GOAL #1   Title Pt will be independent with advanced HEP    Time 6    Period Weeks    Status New      PT LONG TERM GOAL #2   Title report 50% less numbness    Time 6    Period Weeks    Status New      PT LONG TERM GOAL #3   Title increase right grip strength to 6# more than the left    Time 6    Period Weeks    Status New      PT LONG TERM GOAL #4   Title understand proper posture and body mechanics    Time 6    Period Weeks    Status New      PT LONG TERM GOAL #5   Title independent and safe at the gym    Time 6    Period Weeks    Status New                    Plan - 04/05/21 1616     Clinical Impression Statement Patient referred to PT for neck pain with radiculopathy, she reports past x-rays have shown stenosis.  She reports neck pain mostly in the right upper trap, cervical area with some tingling and tenderness in the right arm and hand.  The tenderness is around the elbow.  She is tight anteriorly, rounded shoulders, but holds neck in a good posistion.  Her UR motions were WNL's, has some limitation in cervical motions.  She is very tight and tender in the right upper trap, knot in the  right scalenes and the SCM.    Clinical Decision Making Low    Rehab Potential Good    PT Frequency 2x / week    PT Duration 8 weeks    PT Treatment/Interventions ADLs/Self Care Home Management;Cryotherapy;Electrical Stimulation;Iontophoresis 4mg /ml Dexamethasone;Therapeutic activities;Therapeutic exercise;Balance training;Neuromuscular re-education;Manual techniques;Patient/family  education;Dry needling;Taping;Traction;Moist Heat    PT Next Visit Plan start posture, upper back strength, work on spasms and could try traction    Consulted and Agree with Plan of Care Patient             Patient will benefit from skilled therapeutic intervention in order to improve the following deficits and impairments:  Decreased range of motion, Increased fascial restricitons, Pain, Decreased mobility, Decreased strength, Improper body mechanics, Decreased activity tolerance, Postural dysfunction, Impaired flexibility, Increased muscle spasms  Visit Diagnosis: Cervicalgia - Plan: PT plan of care cert/re-cert  Radiculopathy, cervical region - Plan: PT plan of care cert/re-cert  Cramp and spasm - Plan: PT plan of care cert/re-cert     Problem List Patient Active Problem List   Diagnosis Date Noted   History of mononucleosis 05/09/2017   Depression 05/09/2017   OSA on CPAP 05/09/2017   Chronic fatigue 05/09/2017   Hypothyroidism due to Hashimoto's thyroiditis 05/09/2017   Class 2 severe obesity with serious comorbidity and body mass index (BMI) of 37.0 to 37.9 in adult ([facility]) 05/09/2017   Pain in joint, ankle and foot 07/10/2013   Excessive sleepiness 07/03/2012   Goiter 07/03/2012   Visit for preventive health examination 07/03/2012   Plantar fasciitis of right foot 04/16/2012   Foreign travel 04/09/2012   OTHER SPECIFIED DISORDER OF SKIN 01/28/2010   CARPAL TUNNEL SYNDROME, RIGHT 10/22/2009   IRON DEFICIENCY 07/28/2009   DIARRHEA 12/31/2008   OTHER SPECIFIED DISORDER OF RECTUM AND ANUS 07/24/2008   PRURITUS, ANAL 07/24/2008   SHOULDER PAIN, LEFT 01/24/2008   OBESITY 04/19/2007   LOW BACK PAIN, CHRONIC 03/13/2007   OBSTRUCTIVE SLEEP APNEA 02/26/2007   FATIGUE 01/04/2007   SYMPTOM, ENLARGEMENT, LYMPH NODES 12/20/2006   INTERTRIGO, CANDIDAL 11/16/2006   ABNORMAL RESULT, FUNCTION STUDY, THYROID 11/16/2006   DEPRESSION 09/06/2006   ALLERGIC RHINITIS 09/06/2006    ENDOMETRIOSIS NOS 09/06/2006    11/07/2006, PT 04/05/2021, 5:21 PM  [facility] [address] [place], [place], [place] Phone: [phone]   Fax:  [phone]  Name: Maria Terrell MRN: Carney Living Date of Birth: 20-Feb-1966

## 2021-04-06 ENCOUNTER — Ambulatory Visit: Payer: 59 | Admitting: Physical Therapy

## 2021-04-08 ENCOUNTER — Other Ambulatory Visit: Payer: Self-pay

## 2021-04-08 ENCOUNTER — Encounter: Payer: Self-pay | Admitting: Physical Therapy

## 2021-04-08 ENCOUNTER — Ambulatory Visit: Payer: 59 | Admitting: Physical Therapy

## 2021-04-08 DIAGNOSIS — M542 Cervicalgia: Secondary | ICD-10-CM | POA: Diagnosis not present

## 2021-04-08 DIAGNOSIS — M6281 Muscle weakness (generalized): Secondary | ICD-10-CM

## 2021-04-08 DIAGNOSIS — M5412 Radiculopathy, cervical region: Secondary | ICD-10-CM

## 2021-04-08 DIAGNOSIS — R252 Cramp and spasm: Secondary | ICD-10-CM

## 2021-04-08 NOTE — Patient Instructions (Signed)
Access Code: [facility], [place] URL: [email or web address] Date: 04/08/2021 Prepared by: Oley Balm  Exercises Seated Scapular Retraction - 1 x daily - 7 x weekly - 3 sets - 10 reps - 10 hold Doorway Pec Stretch at 90 Degrees Abduction - 1 x daily - 7 x weekly - 3 sets - 20 hold Shoulder Extension with Resistance - 1 x daily - 7 x weekly - 2 sets - 10 reps Standing Bilateral Low Shoulder Row with Anchored Resistance - 1 x daily - 7 x weekly - 2 sets - 10 reps Shoulder External Rotation and Scapular Retraction with Resistance - 1 x daily - 7 x weekly - 2 sets - 10 reps Cervical Retraction - 1 x daily - 7 x weekly - 2 sets - 10 reps

## 2021-04-08 NOTE — Therapy (Signed)
[place], [place], [address] Phone: [phone]   Fax:  [phone]  Physical Therapy Treatment  Patient Details  Name: Maria Terrell MRN: [id number] Date of Birth: April 06, 1965 Referring Provider (PT): Conley Rolls   Encounter Date: 04/08/2021   PT End of Session - 04/08/21 1015     Visit Number 2    Date for PT Re-Evaluation 07/03/21    Authorization Type [employer]    PT Start Time 0932    PT Stop Time 1012    PT Time Calculation (min) 40 min    Activity Tolerance Patient tolerated treatment well    Behavior During Therapy [facility] for tasks assessed/performed             Past Medical History:  Diagnosis Date   Allergy    Constipation    after gastric surgery having issues    Depression    Endometriosis    Sleep apnea    wears cpap   Thyroid goiter     Past Surgical History:  Procedure Laterality Date   BIOPSY THYROID     BREAST BIOPSY Left    BREAST BIOPSY Right    CHOLECYSTECTOMY  1999   COLONOSCOPY     15 yrs ago   [place]  12/2014   LAPAROTOMY      There were no vitals filed for this visit.   Subjective Assessment - 04/08/21 0935     Subjective Patient reports that she realized she has a baseline of pain that she has just become used to and when she got the numbness and tingling is when she went to seek treatment.    Limitations House hold activities;Reading    How long can you sit comfortably? n/a    How long can you stand comfortably? n/a more limited by back    How long can you walk comfortably? ~20 minutes (lap around neighborhood; usually able to do 2 laps)    Patient Stated Goals have less pain, less affects of pain and tingling on my life    Currently in Pain? Yes    Pain Score 3     Pain Location Neck    Pain Orientation Right    Pain Descriptors / Indicators Aching;Tightness;Numbness    Pain  Type Chronic pain    Pain Radiating Towards R forearm numbness    Pain Onset More than a month ago    Pain Frequency Intermittent                               [facility] Adult PT Treatment/Exercise - 04/08/21 0001       Exercises   Exercises Neck      Neck Exercises: Theraband   Shoulder Extension 20 reps;Green    Rows 20 reps;Green    Shoulder External Rotation 20 reps;Green      Neck Exercises: Standing   Neck Retraction 20 reps;3 secs    Neck Retraction Limitations using ball      Knee/Hip Exercises: Seated   Other Seated Knee/Hip Exercises ant chest stretch, scalenes stretch.      Manual Therapy   Manual Therapy Soft tissue mobilization;Passive ROM;Manual Traction    Soft tissue mobilization scalenes, Up traps, paraspinals. tscalene trigger points.    Passive ROM cervical lateral flexion,    Manual Traction cervical  PT Education - 04/08/21 1010     Education Details HEP.    Person(s) Educated Patient    Methods Explanation;Demonstration;Handout    Comprehension Returned demonstration;Verbalized understanding                 PT Long Term Goals - 04/05/21 1718       PT LONG TERM GOAL #1   Title Pt will be independent with advanced HEP    Time 6    Period Weeks    Status New      PT LONG TERM GOAL #2   Title report 50% less numbness    Time 6    Period Weeks    Status New      PT LONG TERM GOAL #3   Title increase right grip strength to 6# more than the left    Time 6    Period Weeks    Status New      PT LONG TERM GOAL #4   Title understand proper posture and body mechanics    Time 6    Period Weeks    Status New      PT LONG TERM GOAL #5   Title independent and safe at the gym    Time 6    Period Weeks    Status New                   Plan - 04/08/21 1001     Clinical Impression Statement Patient reports mild soreness after last visit. Treatment focused on manula traction,  which patient felt helpful, STM for posterior neck, scalenes, stretching, and scapular stabilization, updating HEP.    Personal Factors and Comorbidities Age;Fitness;Time since onset of injury/illness/exacerbation    Examination-Activity Limitations Locomotion Level;Transfers;Stairs;Squat;Caring for Others;Carry;Lift    Rehab Potential Good    PT Frequency 2x / week    PT Duration 8 weeks    PT Treatment/Interventions ADLs/Self Care Home Management;Cryotherapy;Electrical Stimulation;Iontophoresis 4mg /ml Dexamethasone;Therapeutic activities;Therapeutic exercise;Balance training;Neuromuscular re-education;Manual techniques;Patient/family education;Dry needling;Taping;Traction;Moist Heat    PT Next Visit Plan start posture, upper back strength, work on spasms and could try traction    PT [place] and Agree with Plan of Care Patient             Patient will benefit from skilled therapeutic intervention in order to improve the following deficits and impairments:  Decreased range of motion, Increased fascial restricitons, Pain, Decreased mobility, Decreased strength, Improper body mechanics, Decreased activity tolerance, Postural dysfunction, Impaired flexibility, Increased muscle spasms  Visit Diagnosis: Cervicalgia  Radiculopathy, cervical region  Muscle weakness (generalized)  Cramp and spasm     Problem List Patient Active Problem List   Diagnosis Date Noted   History of mononucleosis 05/09/2017   Depression 05/09/2017   OSA on CPAP 05/09/2017   Chronic fatigue 05/09/2017   Hypothyroidism due to Hashimoto's thyroiditis 05/09/2017   Class 2 severe obesity with serious comorbidity and body mass index (BMI) of 37.0 to 37.9 in adult ([place]) 05/09/2017   Pain in joint, ankle and foot 07/10/2013   Excessive sleepiness 07/03/2012   Goiter 07/03/2012   Visit for preventive health examination 07/03/2012   Plantar fasciitis of right foot 04/16/2012    Foreign travel 04/09/2012   OTHER SPECIFIED DISORDER OF SKIN 01/28/2010   CARPAL TUNNEL SYNDROME, RIGHT 10/22/2009   IRON DEFICIENCY 07/28/2009   DIARRHEA 12/31/2008   OTHER SPECIFIED DISORDER OF RECTUM AND ANUS 07/24/2008   PRURITUS, ANAL 07/24/2008  SHOULDER PAIN, LEFT 01/24/2008   OBESITY 04/19/2007   LOW BACK PAIN, CHRONIC 03/13/2007   OBSTRUCTIVE SLEEP APNEA 02/26/2007   FATIGUE 01/04/2007   SYMPTOM, ENLARGEMENT, LYMPH NODES 12/20/2006   INTERTRIGO, CANDIDAL 11/16/2006   ABNORMAL RESULT, FUNCTION STUDY, THYROID 11/16/2006   DEPRESSION 09/06/2006   ALLERGIC RHINITIS 09/06/2006   ENDOMETRIOSIS NOS 09/06/2006    Marcelina Morel, DPT 04/08/2021, 12:02 PM  [place] [place], [place], [address] Phone: [phone]   Fax:  [phone]  Name: Maria Terrell MRN: [id number] Date of Birth: 11-13-1965

## 2021-04-11 ENCOUNTER — Encounter: Payer: Self-pay | Admitting: Neurology

## 2021-04-11 ENCOUNTER — Ambulatory Visit (INDEPENDENT_AMBULATORY_CARE_PROVIDER_SITE_OTHER): Payer: PRIVATE HEALTH INSURANCE | Admitting: Neurology

## 2021-04-11 VITALS — BP 121/80 | HR 99 | Ht 68.0 in | Wt 264.0 lb

## 2021-04-11 DIAGNOSIS — Z9989 Dependence on other enabling machines and devices: Secondary | ICD-10-CM | POA: Insufficient documentation

## 2021-04-11 DIAGNOSIS — E669 Obesity, unspecified: Secondary | ICD-10-CM

## 2021-04-11 DIAGNOSIS — G4733 Obstructive sleep apnea (adult) (pediatric): Secondary | ICD-10-CM

## 2021-04-11 NOTE — Patient Instructions (Signed)

## 2021-04-11 NOTE — Progress Notes (Signed)
SLEEP MEDICINE CLINIC   Provider:  Larey Seat, M D  Primary Care Physician:  Pershing Cox, MD   Referring Provider: Rodney Booze, MD , [facility]   Chief Complaint  Patient presents with   New Patient (Initial Visit)    Rm 10, alone. Paper referral from Dr. Caren Macadam for sleep apnea. Last seen on 3/13/209 and most recent SS done in 05/2017. Pt currently using her father's old CPAP and had the settings changed by her husband. Pt here to re-visit her OSA and CPAP management. Pt has been using CPAP daily and has no issues or complaints.     HPI:   04-11-2021; RV after a 4 year-hiatus.  Maria Terrell is a 56 y.o. female , seen here upon her PCPs request after a 4 year hiatus. Pt currently using her father's old CPAP and had the settings changed by her husband. Pt here to re-visit her OSA and CPAP management. Pt has been using CPAP daily and has no issues or complaints. She is using CPAP compliantly, has  kept her weight at 260 , 60 pounds lower then prior to surgery. Her CPAP is set at : The patient has used the machine was 97% compliance for days and hours.  On average 7 hours 51 minutes at night.  The minimum pressure is for a maximum pressure of 10 cm of water and her pressure support is 3 cm water.  She is achieving a residual AHI of only 0.7/h so this is an excellent resolution she does have some air leakage which is moderate to 95th percentile leak is 14.9 L/min.  The leak also has increased over the last 30 days so this may be related to the age of headgear or mask.  Overall her sleep machine has been ordered after a home sleep test 5 Watch[employer] in April 2019.  Her AHI was very mild 5.4/h but her RDI was 14.3/h.  Moderate snoring no evidence of hypoxemia normal heart rate variation.  So I think that this machine is now younger than the machine she originally has used but it may approach the 5-year mark.  Her machine was given to her by her father who could not use it anymore.  Would  love to keep her on the same type of machine since she is doing so well visit I reviewed her most recent labs she has been doing well with metabolic labs except for high cholesterol and LDL is very high.  Her CBC with differential was normal again her sleep study by home sleep test will be 56 years old in April of this year but her machine existed before she had that sleep study results.   05-09-2017:  I have the pleasure of meeting today for the first time with Maria Terrell, a 56 year old Caucasian married female who is referral to our sleep clinic is based on her endocrinologist.  The referral notes provided by Rodney Booze, MD includes a chief diagnosis of Hashimoto's disease.  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  She has undergone laparoscopic partial gastrectomy, is using CPAP for OSA, in the past suffered from depression and osteoarthritis she is doing well on Levoxyl 137 mcg.  The patient was first diagnosed with hypothyroidism about 18 years ago and has a family history of hypothyroidism affecting her mother.  She has worked as a Optician, dispensing and worked in Therapist, art intermittently.  She underwent gastric sleeve surgery 2 years ago and lost 90 pounds,  she has been frustrated by her inability by diet and exercise to lose below 200 pounds body weight.  She is taking a multivitamin twice daily calcium and vitamin B12 supplements she does report easily breaking nails, cold intolerance and thinning of her hair.  At one time she took 5000 mcg biotin daily, her periods have become irregular and she is possibly in perimenopause.  A thyroid ultrasound was also quoted, demonstrating a 0.6 cm hypoechoic nodule in the right medial lobe.  Her highest weight prior to sleeve gastrectomy on 05 January 2015 was 318.9 pounds.  Last labs with her endocrinologist quoted in a visit from 02 April 2017 TSH 0.03, free T4 1.5, free T3 2.9.  Her fatigue however has  persisted in spite of the normalization of her thyroid labs.  She is also taking magnesium, Xyzal as needed, microbiologic, occasionally she will need a laxative, she takes Mobic as needed, Zyrtec daily, doxycycline 2 times daily, Trentilex for depression.    Her husband is also a patient in my practice.  The patient had actually seen me in March 2012, 7 years ago, when she was referred at for a sleep study by Sheralyn Boatman, MD. In the meantime she has lost weight, and she has begun using CPAP compliantly- and her husband is using his.  At the 15th March 2012 she was tested for the presence of obstructive sleep apnea and had already been on CPAP.  Her body mass index was 43.7 at the time and her Epworth sleepiness score 16.  Her sleep study documented in apnea-hypercapnia index of 29.3, nearly all respiratory events per hypercapnia.  She did not have prolonged oxygen saturations, during REM sleep her AHI reached 71.6/hr. and in supine sleep 40.4/hr. She has used CPAP for all this years since our meting  7 years ago, and the machine is now over 35 years old and was originally prescribed by Danton Sewer, MD. She mentioned she was initially not compliant until we met.  Her husband feels the machine is not longer serving her well. He has noted break through snoring there are no therapeutic data available.    Chief complaint according to patient : "I am fatigued, not so  much depressed- and very sleepy- while on CPAP "   Sleep habits are as follows: for the last hour before going to bed she watches TV with husband  [name], in the den- she will need 40 minutes to get ready , showers before bedtime, and falls asleep when she even tries to read. She is asleep promptly by midnight.  The patient's bedroom is described as cool, quiet and dark.  She usually prefers to sleep on her side but once she is asleep she always turns to her back.  She sleeps on one phone contoured pillow for head support, and another pillow  between her knees, using a flat mattress with a foam top. She can frequently recall dreaming, not necessarily nightmares. Most nights she does not take a bathroom break. Alarm set at 6 am for medication intake, but she stays in bed until 7 or even 730.  She just does not feel restored and refreshed enough in the morning to start her day easily.  Her sleep is not extremely fragmented and she estimates that she will sleep for well over 6 hours nightly.  Sleep medical history and family sleep history: mother thyroid, everybody is overweight- and many family members have OSA ( 2 of 4 brothers and her father) the others won't  get checked.   Social history: married toBryan for 5 years . Lives with husband and two cats. Work hours are 8.30 to 5 PM and Friday to 1 PM.  No tobacco use and no history-  ETOH - less than once a month. Caffeine -coffee in AM 2 cups. And no sodas, occ. iced tea when eating out.  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    Review of Systems:Out of a complete 14 system review, the patient complains of only the following symptoms, and all other reviewed systems are negative.  Epworth score  How likely are you to doze in the following situations: 0 = not likely, 1 = slight chance, 2 = moderate chance, 3 = high chance  Sitting and Reading?  3 Watching Television?  3 Sitting inactive in a public place (theater or meeting)?  1 As a passenger in a car for an hour without a break?  3  Lying down in the afternoon when circumstances permit? 2 Sitting and talking to someone? 0 Sitting quietly after lunch without alcohol? 2 In a car, while stopped for a few minutes in traffic? 1  Total =  9 on CPAP, down from 15/ 24    , Fatigue severity score  now 34 from 50,  depression score  PHQ 9-  9 points - more hypothymia.    Social History   Socioeconomic History   Marital status: Married    Spouse name: [name]    Number of children: Not on file   Years of education: Not on file   Highest education level: Associate degree: academic program  Occupational History   Not on file  Tobacco Use   Smoking status: Never   Smokeless tobacco: Never  Substance and Sexual Activity   Alcohol use: No   Drug use: No   Sexual activity: Not on file  Other Topics Concern   Not on file  Social History Narrative    married    [employer] (has 2 associate degree)   Right handed   Caffeine: 1-12oz coffee a day   Ft  Work    [employer]    Social Determinants of Radio broadcast assistant Strain: Not on file  Food Insecurity: Not on file  Transportation Needs: Not on file  Physical Activity: Not on file  Stress: Not on file  Social Connections: Not on file  Intimate Partner Violence: Not on file    Family History  Problem Relation Age of Onset   Heart disease Father        stent   Sleep apnea Father    Depression Brother        bipolar   Hyperlipidemia Unknown    Thyroid disease Unknown    Colon polyps Mother    Colon cancer Neg Hx    Esophageal cancer Neg Hx    Rectal cancer Neg Hx    Stomach cancer Neg Hx     Past Medical History:  Diagnosis Date   Allergy    Constipation    after gastric surgery having issues    Depression    Endometriosis    Sleep apnea    wears cpap   Thyroid goiter     Past Surgical History:  Procedure Laterality Date   BIOPSY THYROID     BREAST BIOPSY Left    BREAST BIOPSY Right    CHOLECYSTECTOMY  1999  COLONOSCOPY     15 yrs ago   LAPAROSCOPIC GASTRIC BAND REMOVAL WITH LAPAROSCOPIC GASTRIC SLEEVE RESECTION  12/2014   LAPAROTOMY      Current Outpatient Medications  Medication Sig Dispense Refill   cholecalciferol (VITAMIN D) 1000 units tablet Take 1,000 Units by mouth daily.     clobetasol (TEMOVATE) 0.05 % external solution Apply topically.     Cyanocobalamin (VITAMIN B-12) 1000 MCG SUBL Place 1 tablet under the tongue every other day.      cyclobenzaprine (FLEXERIL) 10 MG tablet Take 10 mg by mouth as needed.     estradiol (ESTRACE) 0.1 MG/GM vaginal cream Place 1 g vaginally 2 (two) times a week.     Krill Oil 500 MG CAPS Take 1 capsule by mouth daily.     Lactobacillus-Inulin (CULTURELLE DIGESTIVE HEALTH PO) Take 1 capsule by mouth daily.     levocetirizine (XYZAL) 5 MG tablet Take 5 mg by mouth every evening.     levothyroxine (SYNTHROID) 100 MCG tablet Take 100 mcg by mouth daily.     LEVOXYL 125 MCG tablet      meloxicam (MOBIC) 15 MG tablet Take 15 mg by mouth daily as needed.      metroNIDAZOLE (METROCREAM) 0.75 % cream Apply 1 application topically as needed.      multivitamin-iron-minerals-folic acid (THERAPEUTIC-M) TABS tablet Take 1 tablet by mouth daily.     OZEMPIC, 0.25 OR 0.5 MG/DOSE, 2 MG/1.5ML SOPN 0.5 mg once a week. Dose will increase to $RemoveBef'1mg'etIFGRaZBC$  next week     polyethylene glycol (MIRALAX / GLYCOLAX) packet Take 17 g by mouth daily.     progesterone (PROMETRIUM) 200 MG capsule Take 200 mg by mouth at bedtime.     rosuvastatin (CRESTOR) 5 MG tablet Take 5 mg by mouth daily.     valACYclovir (VALTREX) 500 MG tablet Take 2 tablets by mouth daily as needed.     vortioxetine HBr (TRINTELLIX) 20 MG TABS tablet TAKE 1 TABLET BY MOUTH EVERY DAY     No current facility-administered medications for this visit.    Allergies as of 04/11/2021 - Review Complete 04/11/2021  Allergen Reaction Noted   Cefaclor Other (See Comments) 11/19/2013    Vitals: BP 121/80    Pulse 99    Ht $R'5\' 8"'QN$  (1.727 m)    Wt 264 lb (119.7 kg)    BMI 40.14 kg/m  Last Weight:  Wt Readings from Last 1 Encounters:  04/11/21 264 lb (119.7 kg)   SAY:TKZS mass index is 40.14 kg/m.     Last Height:   Ht Readings from Last 1 Encounters:  04/11/21 $RemoveB'5\' 8"'fAmvDAJZ$  (1.727 m)    Physical exam:  General: The patient is awake, alert and appears not in acute distress. The patient is well groomed. Head: Normocephalic, atraumatic. Neck is supple. Mallampati 2  neck  circumference:14 ( was 16.5 7 ears ago ). Nasal airflow patent. Mild  Retrognathia. Permanent retainers in place. crossbite Cardiovascular:  Regular rate and rhythm, without  murmurs or carotid bruit, and without distended neck veins. Respiratory: Lungs are clear to auscultation. Skin:  Without evidence of edema, or rash Trunk: BMI is 36.2  . The patient's posture is erect  Neurologic exam : The patient is awake and alert, oriented to place and time.   Memory subjective described as intact.  Attention span & concentration ability appears normal.  Speech is fluent,  without dysarthria, dysphonia or aphasia.  Mood and affect are appropriate.  Cranial nerves: Pupils are equal  and briskly reactive to light. Funduscopic exam without evidence of pallor or edema. Extraocular movements  in vertical and horizontal planes intact and without nystagmus. Visual fields by finger perimetry are intact. Hearing to finger rub intact. Facial sensation intact to fine touch.  Facial motor strength is symmetric and tongue and uvula move midline. Shoulder shrug was symmetrical. Shoulder appears droopy. Body is Pear shaped.    Motor exam:   Normal tone, muscle bulk and symmetric strength in all extremities. Sensory:  Fine touch, pinprick and vibration /Proprioception tested in the upper extremities was normal. She feels her fingers are often cold.  Coordination: Rapid alternating movements/. Finger-to-nose maneuver normal without evidence of ataxia, dysmetria or tremor. Gait and station: Patient walks without assistive device .Tandem gait is unfragmented. Turns with 3 Steps.  Deep tendon reflexes: in the  upper and lower extremities are symmetric and intact. Babinski maneuver response is  downgoing.  Assessment:  After physical and neurologic examination, review of laboratory studies,  Personal review of imaging studies, reports of other /same  Imaging studies, results of polysomnography and / or neurophysiology  testing and pre-existing records as far as provided in visit., my assessment is   1) morbid obesity, BMI 40  Maria Terrell has lost a lot of weight and has adhered to dietary regimen, supplements, and exercise. It has improved her fatigue but not alleviated. She had a gastric sleeve procedure - lost by now 60 pounds.   2) OSA on CPAP - in 2012 her apnea was moderate - in 2019 mild OSA.   3) Chronic fatigue. Muscle pain, myalgia, arthralgia. Thyroid condition seems well controlled. Trains for  cardio and weight lifting 2-3 weekly. Stretching exercises. Gets massages.    The patient was advised of the nature of the diagnosed disorder , the treatment options and the  risks for general health and wellness arising from not treating the condition.   I spent more than 35 minutes of face to face time with the patient.  Greater than 50% of time was spent in counseling and coordination of care. We have discussed the diagnosis and differential and I answered the patient's questions.    Plan:  Treatment plan and additional workup :  I am glad to see her apnea is excellently controlled under an  AIRCURVE V auto RESMED- machine  which her demented father cannot longer use.  We will need to reevaluate this patient if sleep apnea is still present.  She is feeling good on the current CPAP- and I will only be able to replace this machine  by July 2023- in 6 months.  She will need another HST- which usually promote home sleep test.  I  I will order a home sleep test for July- for this patient to confirm if apnea is still present, and we will meet in July 2023.    Larey Seat, MD 0/25/4270, 6:23 PM  Certified in Neurology by ABPN Certified in [place] by [facility] [address] [place], [place] [address]

## 2021-04-13 ENCOUNTER — Encounter: Payer: Self-pay | Admitting: Physical Therapy

## 2021-04-13 ENCOUNTER — Ambulatory Visit: Payer: 59 | Admitting: Physical Therapy

## 2021-04-13 ENCOUNTER — Other Ambulatory Visit: Payer: Self-pay

## 2021-04-13 DIAGNOSIS — M542 Cervicalgia: Secondary | ICD-10-CM | POA: Diagnosis not present

## 2021-04-13 DIAGNOSIS — M5412 Radiculopathy, cervical region: Secondary | ICD-10-CM

## 2021-04-13 DIAGNOSIS — R252 Cramp and spasm: Secondary | ICD-10-CM

## 2021-04-13 DIAGNOSIS — M6281 Muscle weakness (generalized): Secondary | ICD-10-CM

## 2021-04-13 NOTE — Therapy (Signed)
[facility] [address] [place], [place], [address] Phone: [phone]   Fax:  [phone]  Physical Therapy Treatment  Patient Details  Name: Maria Terrell MRN: [id number] Date of Birth: [birth year] Referring Provider (PT): Nicanor Bake   Encounter Date: 04/13/2021   PT End of Session - 04/13/21 1006     Visit Number 3    Date for PT Re-Evaluation 07/03/21    Authorization Type [employer]    PT Start Time 0932    PT Stop Time 1014    PT Time Calculation (min) 42 min    Activity Tolerance Patient tolerated treatment well    Behavior During Therapy [facility] for tasks assessed/performed             Past Medical History:  Diagnosis Date   Allergy    Constipation    after gastric surgery having issues    Depression    Endometriosis    Sleep apnea    wears cpap   Thyroid goiter     Past Surgical History:  Procedure Laterality Date   BIOPSY THYROID     BREAST BIOPSY Left    BREAST BIOPSY Right    CHOLECYSTECTOMY  1999   COLONOSCOPY     15 yrs ago   LAPAROSCOPIC GASTRIC BAND REMOVAL WITH LAPAROSCOPIC GASTRIC SLEEVE RESECTION  12/2014   LAPAROTOMY      There were no vitals filed for this visit.   Subjective Assessment - 04/13/21 0929     Subjective Patient reports she has not been consistently performing the HEP. No real change in her pain.    Currently in Pain? Yes    Pain Score 3     Pain Location Neck    Pain Orientation Right    Pain Descriptors / Indicators Aching;Tightness    Pain Onset More than a month ago    Pain Frequency Intermittent                               [facility] Adult PT Treatment/Exercise - 04/13/21 0001       Neck Exercises: Machines for Strengthening   Lat Pull 35# 2 x 10 reps.      Neck Exercises: Theraband   Shoulder Extension 20 reps;Green    Rows 20 reps;Green    Shoulder External Rotation 20 reps;Green      Modalities   Modalities Traction       Traction   Type of Traction Cervical    Min (lbs) 10    Max (lbs) 12    Hold Time 10    Time 10      Manual Therapy   Manual Therapy Soft tissue mobilization;Passive ROM;Manual Traction    Soft tissue mobilization scalenes, Up traps, paraspinals. tscalene trigger points.    Passive ROM cervical lateral flexion,    Manual Traction cervical                     PT Education - 04/13/21 0950     Education Details Introduced mechanical traction.    Person(s) Educated Patient    Methods Explanation;Demonstration    Comprehension Verbalized understanding;Returned demonstration                 PT Long Term Goals - 04/05/21 1718       PT LONG TERM GOAL #1   Title Pt will be independent with advanced HEP  Time 6    Period Weeks    Status New      PT LONG TERM GOAL #2   Title report 50% less numbness    Time 6    Period Weeks    Status New      PT LONG TERM GOAL #3   Title increase right grip strength to 6# more than the left    Time 6    Period Weeks    Status New      PT LONG TERM GOAL #4   Title understand proper posture and body mechanics    Time 6    Period Weeks    Status New      PT LONG TERM GOAL #5   Title independent and safe at the gym    Time 6    Period Weeks    Status New                   Plan - 04/13/21 2263     Clinical Impression Statement Patient reports she has not been consistently performing HEP. her pain remains about the same. Treatment focused on Manual techniques to mobilize soft tissue in ant and posterior neck and R shoulder. She then performed strengthening for postural muscles and finished with introduction to mechanical traction. She tolerated well.    Personal Factors and Comorbidities Age;Fitness;Time since onset of injury/illness/exacerbation    Examination-Activity Limitations Locomotion Level;Transfers;Stairs;Squat;Caring for Others;Carry;Lift    Clinical Decision Making Low    Rehab Potential Good     PT Frequency 2x / week    PT Duration 8 weeks    PT Treatment/Interventions ADLs/Self Care Home Management;Cryotherapy;Electrical Stimulation;Iontophoresis 4mg /ml Dexamethasone;Therapeutic activities;Therapeutic exercise;Balance training;Neuromuscular re-education;Manual techniques;Patient/family education;Dry needling;Taping;Traction;Moist Heat    PT Next Visit Plan assess response to mechanical traction, Add more trunk stability exer- quadruped? update HEP as appropriate. DN?    PT Home Exercise Plan [id number]    Consulted and Agree with Plan of Care Patient             Patient will benefit from skilled therapeutic intervention in order to improve the following deficits and impairments:  Decreased range of motion, Increased fascial restricitons, Pain, Decreased mobility, Decreased strength, Improper body mechanics, Decreased activity tolerance, Postural dysfunction, Impaired flexibility, Increased muscle spasms  Visit Diagnosis: Cervicalgia  Radiculopathy, cervical region  Muscle weakness (generalized)  Cramp and spasm     Problem List Patient Active Problem List   Diagnosis Date Noted   Dependence on CPAP ventilation 04/11/2021   Obesity (BMI 35.0-39.9 without comorbidity) 04/11/2021   History of mononucleosis 05/09/2017   Depression 05/09/2017   OSA on CPAP 05/09/2017   Chronic fatigue 05/09/2017   Hypothyroidism due to Hashimoto's thyroiditis 05/09/2017   Class 2 severe obesity with serious comorbidity and body mass index (BMI) of 37.0 to 37.9 in adult (HCC) 05/09/2017   Pain in joint, ankle and foot 07/10/2013   Excessive sleepiness 07/03/2012   Goiter 07/03/2012   Visit for preventive health examination 07/03/2012   Plantar fasciitis of right foot 04/16/2012   Foreign travel 04/09/2012   OTHER SPECIFIED DISORDER OF SKIN 01/28/2010   CARPAL TUNNEL SYNDROME, RIGHT 10/22/2009   IRON DEFICIENCY 07/28/2009   DIARRHEA 12/31/2008   OTHER SPECIFIED DISORDER OF RECTUM  AND ANUS 07/24/2008   PRURITUS, ANAL 07/24/2008   SHOULDER PAIN, LEFT 01/24/2008   OBESITY 04/19/2007   LOW BACK PAIN, CHRONIC 03/13/2007   OBSTRUCTIVE SLEEP APNEA 02/26/2007   FATIGUE 01/04/2007   SYMPTOM,  ENLARGEMENT, LYMPH NODES 12/20/2006   INTERTRIGO, CANDIDAL 11/16/2006   ABNORMAL RESULT, FUNCTION STUDY, THYROID 11/16/2006   DEPRESSION 09/06/2006   ALLERGIC RHINITIS 09/06/2006   ENDOMETRIOSIS NOS 09/06/2006    Iona Beard, DPT 04/13/2021, 10:19 AM  [facility] [address] [place], [place], [address] Phone: [phone]   Fax:  [phone]  Name: Maria Terrell MRN: [id number] Date of Birth: February 17, 1966

## 2021-04-14 ENCOUNTER — Encounter: Payer: Self-pay | Admitting: Physical Therapy

## 2021-04-14 ENCOUNTER — Ambulatory Visit: Payer: 59 | Admitting: Physical Therapy

## 2021-04-14 DIAGNOSIS — M5412 Radiculopathy, cervical region: Secondary | ICD-10-CM

## 2021-04-14 DIAGNOSIS — M542 Cervicalgia: Secondary | ICD-10-CM | POA: Diagnosis not present

## 2021-04-14 DIAGNOSIS — M6281 Muscle weakness (generalized): Secondary | ICD-10-CM

## 2021-04-14 DIAGNOSIS — R252 Cramp and spasm: Secondary | ICD-10-CM

## 2021-04-14 NOTE — Therapy (Signed)
[facility] [address] [place], [place], [address] Phone: [phone]   Fax:  [phone]  Physical Therapy Treatment  Patient Details  Name: Maria Terrell MRN: [id number] Date of Birth: 02/07/1966 Referring Provider (PT): Nicanor Bake   Encounter Date: 04/14/2021   PT End of Session - 04/14/21 1625     Visit Number 4    Number of Visits 12    Date for PT Re-Evaluation 07/03/21    Authorization Type [employer]    PT Start Time 1534    PT Stop Time 1612    PT Time Calculation (min) 38 min    Activity Tolerance Patient tolerated treatment well    Behavior During Therapy [facility] for tasks assessed/performed             Past Medical History:  Diagnosis Date   Allergy    Constipation    after gastric surgery having issues    Depression    Endometriosis    Sleep apnea    wears cpap   Thyroid goiter     Past Surgical History:  Procedure Laterality Date   BIOPSY THYROID     BREAST BIOPSY Left    BREAST BIOPSY Right    CHOLECYSTECTOMY  1999   COLONOSCOPY     15 yrs ago   LAPAROSCOPIC GASTRIC BAND REMOVAL WITH LAPAROSCOPIC GASTRIC SLEEVE RESECTION  12/2014   LAPAROTOMY      There were no vitals filed for this visit.   Subjective Assessment - 04/14/21 1535     Subjective My main concern with my neck is the numbness in my fingers and hands, I have known carpal tunnel but there's something else contributing to this and I can't get an MRI until after therapy. I'm not sure what helps it the most, maybe a combination of exercises and needling, massage. I've not been real consistent with HEP, I was just here yesterday.    Currently in Pain? Yes    Pain Score 3     Pain Location Neck    Pain Orientation Right;Left    Pain Descriptors / Indicators Aching;Tightness    Pain Type Chronic pain                               [facility] Adult PT Treatment/Exercise - 04/14/21 0001       Neck Exercises:  Seated   Neck Retraction 10 reps;3 secs    Cervical Rotation 10 reps;Both    Cervical Rotation Limitations with chin tuck    Other Seated Exercise thoracic 3d excursions 1x10B all directions      Knee/Hip Exercises: Aerobic   Other Aerobic UBE L2 x3 min forward/3 min backward      Manual Therapy   Manual Therapy Soft tissue mobilization    Soft tissue mobilization R proximal forearm trigger points, UT, anterior scalenes                     PT Education - 04/14/21 1625     Education Details exercise form/purpose    Person(s) Educated Patient    Methods Explanation    Comprehension Verbalized understanding                 PT Long Term Goals - 04/05/21 1718       PT LONG TERM GOAL #1   Title Pt will be independent with advanced HEP    Time  6    Period Weeks    Status New      PT LONG TERM GOAL #2   Title report 50% less numbness    Time 6    Period Weeks    Status New      PT LONG TERM GOAL #3   Title increase right grip strength to 6# more than the left    Time 6    Period Weeks    Status New      PT LONG TERM GOAL #4   Title understand proper posture and body mechanics    Time 6    Period Weeks    Status New      PT LONG TERM GOAL #5   Title independent and safe at the gym    Time 6    Period Weeks    Status New                   Plan - 04/14/21 1626     Clinical Impression Statement Maria Terrell arrives today doing OK, still having a lot of numbness in her hands but doing ok. Tells me sometimes working on trigger points in her forearm can help her R hand numbness, we worked on this a bit today and continued STM to upper traps today. Followed this with exercise on UBE and general mobility and postural exercises to thoracic and cervical spines. Seemed to tolerate all interventions well today.    Personal Factors and Comorbidities Age;Fitness;Time since onset of injury/illness/exacerbation    Examination-Activity Limitations  Locomotion Level;Transfers;Stairs;Squat;Caring for Others;Carry;Lift    Examination-Participation Restrictions Community Activity;Shop    Stability/Clinical Decision Making Stable/Uncomplicated    Clinical Decision Making Low    Rehab Potential Good    PT Frequency 2x / week    PT Duration 8 weeks    PT Treatment/Interventions ADLs/Self Care Home Management;Cryotherapy;Electrical Stimulation;Iontophoresis 4mg /ml Dexamethasone;Therapeutic activities;Therapeutic exercise;Balance training;Neuromuscular re-education;Manual techniques;Patient/family education;Dry needling;Taping;Traction;Moist Heat    PT Next Visit Plan assess response to mechanical traction, Add more trunk stability exer- quadruped? update HEP as appropriate. DN?    PT Home Exercise Plan [id number]    Consulted and Agree with Plan of Care Patient             Patient will benefit from skilled therapeutic intervention in order to improve the following deficits and impairments:  Decreased range of motion, Increased fascial restricitons, Pain, Decreased mobility, Decreased strength, Improper body mechanics, Decreased activity tolerance, Postural dysfunction, Impaired flexibility, Increased muscle spasms  Visit Diagnosis: Cervicalgia  Radiculopathy, cervical region  Muscle weakness (generalized)  Cramp and spasm     Problem List Patient Active Problem List   Diagnosis Date Noted   Dependence on CPAP ventilation 04/11/2021   Obesity (BMI 35.0-39.9 without comorbidity) 04/11/2021   History of mononucleosis 05/09/2017   Depression 05/09/2017   OSA on CPAP 05/09/2017   Chronic fatigue 05/09/2017   Hypothyroidism due to Hashimoto's thyroiditis 05/09/2017   Class 2 severe obesity with serious comorbidity and body mass index (BMI) of 37.0 to 37.9 in adult (HCC) 05/09/2017   Pain in joint, ankle and foot 07/10/2013   Excessive sleepiness 07/03/2012   Goiter 07/03/2012   Visit for preventive health examination 07/03/2012    Plantar fasciitis of right foot 04/16/2012   Foreign travel 04/09/2012   OTHER SPECIFIED DISORDER OF SKIN 01/28/2010   CARPAL TUNNEL SYNDROME, RIGHT 10/22/2009   IRON DEFICIENCY 07/28/2009   DIARRHEA 12/31/2008   OTHER SPECIFIED DISORDER OF RECTUM AND ANUS  07/24/2008   PRURITUS, ANAL 07/24/2008   SHOULDER PAIN, LEFT 01/24/2008   OBESITY 04/19/2007   LOW BACK PAIN, CHRONIC 03/13/2007   OBSTRUCTIVE SLEEP APNEA 02/26/2007   FATIGUE 01/04/2007   SYMPTOM, ENLARGEMENT, LYMPH NODES 12/20/2006   INTERTRIGO, CANDIDAL 11/16/2006   ABNORMAL RESULT, FUNCTION STUDY, THYROID 11/16/2006   DEPRESSION 09/06/2006   ALLERGIC RHINITIS 09/06/2006   ENDOMETRIOSIS NOS 09/06/2006   Lerry Liner PT, DPT, PN2   Supplemental Physical Therapist        [facility] [address] [place], [place], [address] Phone: [phone]   Fax:  [phone]  Name: Maria Terrell MRN: [id number] Date of Birth: May 18, 1965

## 2021-04-20 ENCOUNTER — Ambulatory Visit: Payer: 59 | Admitting: Physical Therapy

## 2021-04-20 ENCOUNTER — Other Ambulatory Visit: Payer: Self-pay

## 2021-04-20 ENCOUNTER — Encounter: Payer: Self-pay | Admitting: Physical Therapy

## 2021-04-20 DIAGNOSIS — M5412 Radiculopathy, cervical region: Secondary | ICD-10-CM

## 2021-04-20 DIAGNOSIS — G8929 Other chronic pain: Secondary | ICD-10-CM

## 2021-04-20 DIAGNOSIS — M542 Cervicalgia: Secondary | ICD-10-CM

## 2021-04-20 DIAGNOSIS — M6281 Muscle weakness (generalized): Secondary | ICD-10-CM

## 2021-04-20 DIAGNOSIS — R252 Cramp and spasm: Secondary | ICD-10-CM

## 2021-04-20 NOTE — Therapy (Signed)
[facility] [address] [place], [place], [address] Phone: [phone]   Fax:  [phone]  Physical Therapy Treatment  Patient Details  Name: Maria Terrell MRN: [id number] Date of Birth: 10-Feb-1966 Referring Provider (PT): Nicanor Bake   Encounter Date: 04/20/2021   PT End of Session - 04/20/21 1621     Visit Number 5    Number of Visits 12    Date for PT Re-Evaluation 07/03/21    Authorization Type [employer]    PT Start Time 1545    PT Stop Time 1629    PT Time Calculation (min) 44 min    Activity Tolerance Patient tolerated treatment well    Behavior During Therapy [facility] for tasks assessed/performed             Past Medical History:  Diagnosis Date   Allergy    Constipation    after gastric surgery having issues    Depression    Endometriosis    Sleep apnea    wears cpap   Thyroid goiter     Past Surgical History:  Procedure Laterality Date   BIOPSY THYROID     BREAST BIOPSY Left    BREAST BIOPSY Right    CHOLECYSTECTOMY  1999   COLONOSCOPY     15 yrs ago   LAPAROSCOPIC GASTRIC BAND REMOVAL WITH LAPAROSCOPIC GASTRIC SLEEVE RESECTION  12/2014   LAPAROTOMY      There were no vitals filed for this visit.   Subjective Assessment - 04/20/21 1545     Subjective Patient reports that the traction did seem to help temporarily.    Currently in Pain? Yes    Pain Score 3     Pain Location Neck    Pain Orientation Right;Left    Pain Descriptors / Indicators Aching;Tightness    Pain Type Chronic pain    Pain Onset More than a month ago    Pain Frequency Intermittent                [facility] PT Assessment - 04/20/21 0001       Special Tests   Other special tests UE neural tension test. Produced tightness in BUE, no tingling or numbness.                           [facility] Adult PT Treatment/Exercise - 04/20/21 0001       Traction   Type of Traction Cervical    Min (lbs) 10     Max (lbs) 14    Hold Time 10    Time 10      Manual Therapy   Manual Therapy Soft tissue mobilization;Passive ROM;Neural Stretch;Manual Traction    Soft tissue mobilization cervical paraspinals, Up Traps    Passive ROM Passive stretch to SCM, scalenes, UT B.    Manual Traction cervical    Neural Stretch Median Nerve stretch B. Reviewed UE nerve glides and tension-patient had been educated previously.                     PT Education - 04/20/21 1620     Education Details Median nerve stretch, reviewed Neural glides and tension for BUE.    Person(s) Educated Patient    Methods Explanation;Demonstration    Comprehension Returned demonstration;Verbalized understanding                 PT Long Term Goals - 04/20/21 1805  PT LONG TERM GOAL #1   Title Pt will be independent with advanced HEP    Time 6    Period Weeks    Status New      PT LONG TERM GOAL #2   Title report 50% less numbness    Baseline 6/10    Time 4    Period Weeks    Status On-going      PT LONG TERM GOAL #3   Title increase right grip strength to 6# more than the left    Time 6    Period Weeks    Status New      PT LONG TERM GOAL #4   Title understand proper posture and body mechanics    Time 6    Period Weeks    Status New      PT LONG TERM GOAL #5   Title independent and safe at the gym    Baseline Has not yet returned.    Time 3    Period Weeks    Status On-going    Target Date 05/11/21                   Plan - 04/20/21 1622     Clinical Impression Statement Patient reports temporary relief from the mechanical traction. FUrther assessed UE neural tension due to C/O N/T in hands. Applied Traction again for further assessment.    Personal Factors and Comorbidities Age;Fitness;Time since onset of injury/illness/exacerbation    Examination-Activity Limitations Locomotion Level;Transfers;Stairs;Squat;Caring for Others;Carry;Lift    Examination-Participation  Restrictions Community Activity;Shop    Stability/Clinical Decision Making Stable/Uncomplicated    Clinical Decision Making Low    Rehab Potential Good    PT Frequency 2x / week    PT Duration 8 weeks    PT Treatment/Interventions ADLs/Self Care Home Management;Cryotherapy;Electrical Stimulation;Iontophoresis 4mg /ml Dexamethasone;Therapeutic activities;Therapeutic exercise;Balance training;Neuromuscular re-education;Manual techniques;Patient/family education;Dry needling;Taping;Traction;Moist Heat    PT Next Visit Plan assess response to mechanical traction, Add more trunk stability exer- quadruped? update HEP as appropriate. DN?    PT Home Exercise Plan [id number]    Consulted and Agree with Plan of Care Patient             Patient will benefit from skilled therapeutic intervention in order to improve the following deficits and impairments:  Decreased range of motion, Increased fascial restricitons, Pain, Decreased mobility, Decreased strength, Improper body mechanics, Decreased activity tolerance, Postural dysfunction, Impaired flexibility, Increased muscle spasms  Visit Diagnosis: Cervicalgia  Radiculopathy, cervical region  Muscle weakness (generalized)  Cramp and spasm  Chronic pain of right knee     Problem List Patient Active Problem List   Diagnosis Date Noted   Dependence on CPAP ventilation 04/11/2021   Obesity (BMI 35.0-39.9 without comorbidity) 04/11/2021   History of mononucleosis 05/09/2017   Depression 05/09/2017   OSA on CPAP 05/09/2017   Chronic fatigue 05/09/2017   Hypothyroidism due to Hashimoto's thyroiditis 05/09/2017   Class 2 severe obesity with serious comorbidity and body mass index (BMI) of 37.0 to 37.9 in adult (HCC) 05/09/2017   Pain in joint, ankle and foot 07/10/2013   Excessive sleepiness 07/03/2012   Goiter 07/03/2012   Visit for preventive health examination 07/03/2012   Plantar fasciitis of right foot 04/16/2012   Foreign travel  04/09/2012   OTHER SPECIFIED DISORDER OF SKIN 01/28/2010   CARPAL TUNNEL SYNDROME, RIGHT 10/22/2009   IRON DEFICIENCY 07/28/2009   DIARRHEA 12/31/2008   OTHER SPECIFIED DISORDER OF RECTUM AND ANUS 07/24/2008  PRURITUS, ANAL 07/24/2008   SHOULDER PAIN, LEFT 01/24/2008   OBESITY 04/19/2007   LOW BACK PAIN, CHRONIC 03/13/2007   OBSTRUCTIVE SLEEP APNEA 02/26/2007   FATIGUE 01/04/2007   SYMPTOM, ENLARGEMENT, LYMPH NODES 12/20/2006   INTERTRIGO, CANDIDAL 11/16/2006   ABNORMAL RESULT, FUNCTION STUDY, THYROID 11/16/2006   DEPRESSION 09/06/2006   ALLERGIC RHINITIS 09/06/2006   ENDOMETRIOSIS NOS 09/06/2006    Iona Beard, DPT 04/20/2021, 6:08 PM  [facility] [address] [place], [place], [address] Phone: [phone]   Fax:  [phone]  Name: IYANA TOPOR MRN: [id number] Date of Birth: 05-04-1965

## 2021-04-22 ENCOUNTER — Encounter: Payer: Self-pay | Admitting: Physical Therapy

## 2021-04-22 ENCOUNTER — Ambulatory Visit: Payer: 59 | Admitting: Physical Therapy

## 2021-04-22 ENCOUNTER — Other Ambulatory Visit: Payer: Self-pay

## 2021-04-22 DIAGNOSIS — M542 Cervicalgia: Secondary | ICD-10-CM

## 2021-04-22 DIAGNOSIS — G8929 Other chronic pain: Secondary | ICD-10-CM

## 2021-04-22 DIAGNOSIS — R252 Cramp and spasm: Secondary | ICD-10-CM

## 2021-04-22 DIAGNOSIS — M6281 Muscle weakness (generalized): Secondary | ICD-10-CM

## 2021-04-22 DIAGNOSIS — M5412 Radiculopathy, cervical region: Secondary | ICD-10-CM

## 2021-04-22 NOTE — Therapy (Signed)
[place]. [place], [place], [address] Phone: [phone]   Fax:  [phone]  Physical Therapy Treatment  Patient Details  Name: Maria Terrell MRN: [id number] Date of Birth: May 08, 1965 Referring Provider (PT): Conley Rolls   Encounter Date: 04/22/2021   PT End of Session - 04/22/21 1057     Visit Number 6    Number of Visits 12    Date for PT Re-Evaluation 07/03/21    Authorization Type [employer]    PT Start Time 1018    PT Stop Time 1056    PT Time Calculation (min) 38 min    Activity Tolerance Patient tolerated treatment well    Behavior During Therapy [facility] for tasks assessed/performed             Past Medical History:  Diagnosis Date   Allergy    Constipation    after gastric surgery having issues    Depression    Endometriosis    Sleep apnea    wears cpap   Thyroid goiter     Past Surgical History:  Procedure Laterality Date   BIOPSY THYROID     BREAST BIOPSY Left    BREAST BIOPSY Right    CHOLECYSTECTOMY  1999   COLONOSCOPY     15 yrs ago   [place]  12/2014   LAPAROTOMY      There were no vitals filed for this visit.   Subjective Assessment - 04/22/21 1020     Subjective Patient reports no real changes after mechanical traction.    Limitations House hold activities;Reading    How long can you sit comfortably? n/a    How long can you stand comfortably? n/a more limited by back    How long can you walk comfortably? ~20 minutes (lap around neighborhood; usually able to do 2 laps)    Patient Stated Goals have less pain, less affects of pain and tingling on my life    Currently in Pain? Yes    Pain Score 3     Pain Location Neck    Pain Orientation Right;Left    Pain Descriptors / Indicators Tightness    Pain Type Chronic pain    Pain Onset More than a month ago    Pain Frequency Intermittent                                [facility] Adult PT Treatment/Exercise - 04/22/21 0001       Neck Exercises: Supine   Other Supine Exercise chin tucks      Neck Exercises: Stretches   Other Neck Stretches thoracic flex/roation stretch, 3 x 10 sec to each side. TC for upright posture in between each rep.      Knee/Hip Exercises: Prone   Other Prone Exercises Quadruped- cat/cow, single arm reach, single leg reach, opp arm and leg reach, 5 reps each    Other Prone Exercises Queadruped thoracic stretch 3 x 15 sec on each side.      Manual Therapy   Manual Therapy Soft tissue mobilization;Passive ROM;Neural Stretch;Manual Traction    Soft tissue mobilization cervical paraspinals, Up Traps    Passive ROM Passive stretch to SCM, scalenes, UT B.    Manual Traction cervical                     PT Education -  04/22/21 1056     Education Details Updated HEP    Person(s) Educated Patient    Methods Explanation;Handout;Demonstration    Comprehension Verbalized understanding;Returned demonstration                 PT Long Term Goals - 04/20/21 1805       PT LONG TERM GOAL #1   Title Pt will be independent with advanced HEP    Time 6    Period Weeks    Status New      PT LONG TERM GOAL #2   Title report 50% less numbness    Baseline 6/10    Time 4    Period Weeks    Status On-going      PT LONG TERM GOAL #3   Title increase right grip strength to 6# more than the left    Time 6    Period Weeks    Status New      PT LONG TERM GOAL #4   Title understand proper posture and body mechanics    Time 6    Period Weeks    Status New      PT LONG TERM GOAL #5   Title independent and safe at the gym    Baseline Has not yet returned.    Time 3    Period Weeks    Status On-going    Target Date 05/11/21                   Plan - 04/22/21 1057     Clinical Impression Statement Patient reports minimal relief after traction last time. [place]  emphasized trunk stability and thoracic mobility.    Personal Factors and Comorbidities Age;Fitness;Time since onset of injury/illness/exacerbation    Examination-Activity Limitations Locomotion Level;Transfers;Stairs;Squat;Caring for Others;Carry;Lift    Examination-Participation Restrictions Community Activity;Shop    Stability/Clinical Decision Making Stable/Uncomplicated    Clinical Decision Making Low    Rehab Potential Good    PT Frequency 2x / week    PT Duration 6 weeks    PT Treatment/Interventions ADLs/Self Care Home Management;Cryotherapy;Electrical Stimulation;Iontophoresis 4mg /ml Dexamethasone;Therapeutic activities;Therapeutic exercise;Balance training;Neuromuscular re-education;Manual techniques;Patient/family education;Dry needling;Taping;Traction;Moist Heat    PT Next Visit Plan Assess thoracic mobility tolerance. Pect minor stretching.    PT Home Exercise Plan [id number]    Consulted and Agree with Plan of Care Patient             Patient will benefit from skilled therapeutic intervention in order to improve the following deficits and impairments:  Decreased range of motion, Increased fascial restricitons, Pain, Decreased mobility, Decreased strength, Improper body mechanics, Decreased activity tolerance, Postural dysfunction, Impaired flexibility, Increased muscle spasms  Visit Diagnosis: Cervicalgia  Radiculopathy, cervical region  Muscle weakness (generalized)  Cramp and spasm  Chronic pain of right knee     Problem List Patient Active Problem List   Diagnosis Date Noted   Dependence on CPAP ventilation 04/11/2021   Obesity (BMI 35.0-39.9 without comorbidity) 04/11/2021   History of mononucleosis 05/09/2017   Depression 05/09/2017   OSA on CPAP 05/09/2017   Chronic fatigue 05/09/2017   Hypothyroidism due to Hashimoto's thyroiditis 05/09/2017   Class 2 severe obesity with serious comorbidity and body mass index (BMI) of 37.0 to 37.9 in adult ([place])  05/09/2017   Pain in joint, ankle and foot 07/10/2013   Excessive sleepiness 07/03/2012   Goiter 07/03/2012   Visit for preventive health examination 07/03/2012   Plantar fasciitis of right foot 04/16/2012   Foreign  travel 04/09/2012   OTHER SPECIFIED DISORDER OF SKIN 01/28/2010   CARPAL TUNNEL SYNDROME, RIGHT 10/22/2009   IRON DEFICIENCY 07/28/2009   DIARRHEA 12/31/2008   OTHER SPECIFIED DISORDER OF RECTUM AND ANUS 07/24/2008   PRURITUS, ANAL 07/24/2008   SHOULDER PAIN, LEFT 01/24/2008   OBESITY 04/19/2007   LOW BACK PAIN, CHRONIC 03/13/2007   OBSTRUCTIVE SLEEP APNEA 02/26/2007   FATIGUE 01/04/2007   SYMPTOM, ENLARGEMENT, LYMPH NODES 12/20/2006   INTERTRIGO, CANDIDAL 11/16/2006   ABNORMAL RESULT, FUNCTION STUDY, THYROID 11/16/2006   DEPRESSION 09/06/2006   ALLERGIC RHINITIS 09/06/2006   ENDOMETRIOSIS NOS 09/06/2006    Marcelina Morel, DPT 04/22/2021, 10:59 AM  [place]. [place], [place], [address] Phone: [phone]   Fax:  [phone]  Name: Maria Terrell MRN: [id number] Date of Birth: [birth year]

## 2021-04-22 NOTE — Patient Instructions (Addendum)
Access Code: [facility] URL: [email or web address] Date: 04/22/2021 Prepared by: Oley Balm  Exercises Seated Thoracic Flexion and Rotation with Arms Crossed - 1 x daily - 7 x weekly - 3 reps - 15 hold Quadruped Thoracic Rotation with Hand on Neck - 1 x daily - 7 x weekly - 3 reps - 15 hold Quadruped Alternating Arm Lift - 1 x daily - 7 x weekly - 2 sets - 10 reps Quadruped Alternating Leg Extensions - 1 x daily - 7 x weekly - 2 sets - 10 reps Bird Dog - 1 x daily - 7 x weekly - 2 sets - 10 reps Doorway Pec Stretch at 60 Elevation - 1 x daily - 7 x weekly - 3 reps - 15 hold Doorway Pec Stretch at 120 Degrees Abduction - 1 x daily - 7 x weekly - 3 reps - 15 hold

## 2021-04-25 ENCOUNTER — Other Ambulatory Visit: Payer: Self-pay

## 2021-04-25 ENCOUNTER — Ambulatory Visit: Payer: 59 | Admitting: Physical Therapy

## 2021-04-25 ENCOUNTER — Encounter: Payer: Self-pay | Admitting: Physical Therapy

## 2021-04-25 DIAGNOSIS — R252 Cramp and spasm: Secondary | ICD-10-CM

## 2021-04-25 DIAGNOSIS — M5412 Radiculopathy, cervical region: Secondary | ICD-10-CM

## 2021-04-25 DIAGNOSIS — M6281 Muscle weakness (generalized): Secondary | ICD-10-CM

## 2021-04-25 DIAGNOSIS — M542 Cervicalgia: Secondary | ICD-10-CM | POA: Diagnosis not present

## 2021-04-25 NOTE — Therapy (Signed)
[place]. [place], [place], [address] Phone: [phone]   Fax:  [phone]  Physical Therapy Treatment  Patient Details  Name: Maria Terrell MRN: [id number] Date of Birth: Jun 13, 1965 Referring Provider (PT): Conley Rolls   Encounter Date: 04/25/2021   PT End of Session - 04/25/21 1529     Visit Number 7    Number of Visits 12    Date for PT Re-Evaluation 07/03/21    Authorization Type [employer]    PT Start Time 1503    PT Stop Time [id number]    PT Time Calculation (min) 39 min    Activity Tolerance Patient tolerated treatment well    Behavior During Therapy [facility] for tasks assessed/performed             Past Medical History:  Diagnosis Date   Allergy    Constipation    after gastric surgery having issues    Depression    Endometriosis    Sleep apnea    wears cpap   Thyroid goiter     Past Surgical History:  Procedure Laterality Date   BIOPSY THYROID     BREAST BIOPSY Left    BREAST BIOPSY Right    CHOLECYSTECTOMY  1999   COLONOSCOPY     15 yrs ago   [place]  12/2014   LAPAROTOMY      There were no vitals filed for this visit.   Subjective Assessment - 04/25/21 1507     Subjective Patietn reports that she feels temporary improvement after performing HEP.    Limitations House hold activities;Reading    Currently in Pain? No/denies                               [facility] Adult PT Treatment/Exercise - 04/25/21 0001       Neck Exercises: Standing   Wall Wash Engaging serratus iwth wall slides 10 reps.    Other Standing Exercises Paloff press iwth 10 pounds resistance.      Knee/Hip Exercises: Supine   Bridges Strengthening;Both;1 set;10 reps    Single Leg Bridge Strengthening;Both;1 set;5 reps    Other Supine Knee/Hip Exercises Supine abdominal exercises. marching x 10 reps each, then starting in  90/90 hip and knee flexion, alternating toe taps. 2 x 5 reps each. Both performed while maintianing PPT.                     PT Education - 04/25/21 1529     Education Details Trunk stabilization exercises.    Person(s) Educated Patient    Methods Explanation;Demonstration    Comprehension Returned demonstration;Verbalized understanding                 PT Long Term Goals - 04/25/21 1545       PT LONG TERM GOAL #1   Title Pt will be independent with advanced HEP    Time 6    Period Weeks    Status New      PT LONG TERM GOAL #2   Title report 50% less numbness    Baseline 6/10    Time 4    Period Weeks    Status On-going      PT LONG TERM GOAL #3   Title increase right grip strength to 6# more than the left    Time 6  Period Weeks    Status New      PT LONG TERM GOAL #4   Title understand proper posture and body mechanics    Time 6    Period Weeks    Status Achieved      PT LONG TERM GOAL #5   Title independent and safe at the gym    Baseline Has not yet returned.    Time 3    Period Weeks    Status On-going    Target Date 05/11/21                   Plan - 04/25/21 1530     Clinical Impression Statement Patient reports temporary relief from pain following HEP. She is scheduled for an MRI in mid March. Emphasized trunk mobility and stability today in order to improve posture.    Personal Factors and Comorbidities Age;Fitness;Time since onset of injury/illness/exacerbation    Examination-Activity Limitations Locomotion Level;Transfers;Stairs;Squat;Caring for Others;Carry;Lift    Examination-Participation Restrictions Community Activity;Shop    Stability/Clinical Decision Making Stable/Uncomplicated    Rehab Potential Good    PT Frequency 2x / week    PT Duration 6 weeks    PT Treatment/Interventions ADLs/Self Care Home Management;Cryotherapy;Electrical Stimulation;Iontophoresis 4mg /ml Dexamethasone;Therapeutic  activities;Therapeutic exercise;Balance training;Neuromuscular re-education;Manual techniques;Patient/family education;Dry needling;Taping;Traction;Moist Heat    PT Next Visit Plan Assess thoracic mobility tolerance. Pect minor stretching.    PT Home Exercise Plan [id number]    Consulted and Agree with Plan of Care Patient             Patient will benefit from skilled therapeutic intervention in order to improve the following deficits and impairments:  Decreased range of motion, Increased fascial restricitons, Pain, Decreased mobility, Decreased strength, Improper body mechanics, Decreased activity tolerance, Postural dysfunction, Impaired flexibility, Increased muscle spasms  Visit Diagnosis: Cervicalgia  Radiculopathy, cervical region  Muscle weakness (generalized)  Cramp and spasm     Problem List Patient Active Problem List   Diagnosis Date Noted   Dependence on CPAP ventilation 04/11/2021   Obesity (BMI 35.0-39.9 without comorbidity) 04/11/2021   History of mononucleosis 05/09/2017   Depression 05/09/2017   OSA on CPAP 05/09/2017   Chronic fatigue 05/09/2017   Hypothyroidism due to Hashimoto's thyroiditis 05/09/2017   Class 2 severe obesity with serious comorbidity and body mass index (BMI) of 37.0 to 37.9 in adult ([place]) 05/09/2017   Pain in joint, ankle and foot 07/10/2013   Excessive sleepiness 07/03/2012   Goiter 07/03/2012   Visit for preventive health examination 07/03/2012   Plantar fasciitis of right foot 04/16/2012   Foreign travel 04/09/2012   OTHER SPECIFIED DISORDER OF SKIN 01/28/2010   CARPAL TUNNEL SYNDROME, RIGHT 10/22/2009   IRON DEFICIENCY 07/28/2009   DIARRHEA 12/31/2008   OTHER SPECIFIED DISORDER OF RECTUM AND ANUS 07/24/2008   PRURITUS, ANAL 07/24/2008   SHOULDER PAIN, LEFT 01/24/2008   OBESITY 04/19/2007   LOW BACK PAIN, CHRONIC 03/13/2007   OBSTRUCTIVE SLEEP APNEA 02/26/2007   FATIGUE 01/04/2007   SYMPTOM, ENLARGEMENT, LYMPH NODES  12/20/2006   INTERTRIGO, CANDIDAL 11/16/2006   ABNORMAL RESULT, FUNCTION STUDY, THYROID 11/16/2006   DEPRESSION 09/06/2006   ALLERGIC RHINITIS 09/06/2006   ENDOMETRIOSIS NOS 09/06/2006    Maria Terrell, DPT 04/25/2021, 3:46 PM  [place]. [place], [place], [address] Phone: [phone]   Fax:  [phone]  Name: Maria Terrell MRN: [id number] Date of Birth: [birth year]

## 2021-04-27 ENCOUNTER — Encounter: Payer: Self-pay | Admitting: Physical Therapy

## 2021-04-27 ENCOUNTER — Ambulatory Visit: Payer: 59 | Attending: Student | Admitting: Physical Therapy

## 2021-04-27 ENCOUNTER — Other Ambulatory Visit: Payer: Self-pay

## 2021-04-27 DIAGNOSIS — M542 Cervicalgia: Secondary | ICD-10-CM | POA: Insufficient documentation

## 2021-04-27 DIAGNOSIS — M6281 Muscle weakness (generalized): Secondary | ICD-10-CM | POA: Diagnosis present

## 2021-04-27 DIAGNOSIS — R2689 Other abnormalities of gait and mobility: Secondary | ICD-10-CM | POA: Diagnosis present

## 2021-04-27 DIAGNOSIS — M5412 Radiculopathy, cervical region: Secondary | ICD-10-CM | POA: Insufficient documentation

## 2021-04-27 DIAGNOSIS — R252 Cramp and spasm: Secondary | ICD-10-CM | POA: Diagnosis present

## 2021-04-27 NOTE — Therapy (Signed)
[place] ?[place] [address] ?[address] ?[place], [place], [address] ?Phone: [phone]   Fax:  [phone] ? ?Physical Therapy Treatment ? ?Patient Details  ?Name: Maria Terrell ?MRN: [id number] ?Date of Birth: 10/18/1965 ?Referring Provider (PT): Conley Rolls ? ? ?Encounter Date: 04/27/2021 ? ? PT End of Session - 04/27/21 0919   ? ? Visit Number 8   ? Date for PT Re-Evaluation 07/03/21   ? Authorization Type [employer]   ? PT Start Time 0750   ? PT Stop Time 0831   ? PT Time Calculation (min) 41 min   ? Activity Tolerance Patient tolerated treatment well   ? Behavior During Therapy [facility] for tasks assessed/performed   ? ?  ?  ? ?  ? ? ?Past Medical History:  ?Diagnosis Date  ? Allergy   ? Constipation   ? after gastric surgery having issues   ? Depression   ? Endometriosis   ? Sleep apnea   ? wears cpap  ? Thyroid goiter   ? ? ?Past Surgical History:  ?Procedure Laterality Date  ? BIOPSY THYROID    ? BREAST BIOPSY Left   ? BREAST BIOPSY Right   ? CHOLECYSTECTOMY  1999  ? COLONOSCOPY    ? 15 yrs ago  ? LAPAROSCOPIC GASTRIC BAND REMOVAL WITH LAPAROSCOPIC GASTRIC SLEEVE RESECTION  12/2014  ? LAPAROTOMY    ? ? ?There were no vitals filed for this visit. ? ? Subjective Assessment - 04/27/21 0916   ? ? Subjective Patient reports not much overall change, has MRI scheduled during this month, reports just feels real tight   ? Currently in Pain? Yes   ? Pain Score 3    ? Pain Location Neck   ? Pain Descriptors / Indicators Spasm;Tightness   ? Pain Relieving Factors not much has helped   ? ?  ?  ? ?  ? ? ? ? ? ? ? ? ? ? ? ? ? ? ? ? ? ? ? ? [place] [facility] Adult PT Treatment/Exercise - 04/27/21 0001   ? ?  ? Manual Therapy  ? Manual Therapy Joint mobilization   ? Joint Mobilization thoracic mobs and scapular mobs   ? Soft tissue mobilization cervical paraspinals, Up Traps, scalenes and SCM, rhomboids   ? Myofascial Release upper trap and scalene   ? Passive ROM neck side bending and rotaiton   ?  Manual Traction occipital release, some right scapular mobilization   ? Neural Stretch brachial plexus   ? ?  ?  ? ?  ? ? ? Trigger Point Dry Needling - 04/27/21 0001   ? ? Consent Given? Yes   ? Education Handout Provided Yes   ? Muscles Treated Head and Neck Upper trapezius   ? Upper Trapezius Response Twitch reponse elicited;Palpable increased muscle length   ? ?  ?  ? ?  ? ? ? ? ? ? ? ? ? ? ? ? ? PT Long Term Goals - 04/27/21 0922   ? ?  ? PT LONG TERM GOAL #1  ? Title Pt will be independent with advanced HEP   ? Status Partially Met   ?  ? PT LONG TERM GOAL #2  ? Title report 50% less numbness   ? Status On-going   ?  ? PT LONG TERM GOAL #3  ? Title increase right grip strength to 6# more than the left   ? Status On-going   ?  ? PT LONG  TERM GOAL #4  ? Title understand proper posture and body mechanics   ? Status Achieved   ? ?  ?  ? ?  ? ? ? ? ? ? ? ? Plan - 04/27/21 0919   ? ? Clinical Impression Statement I changed to all manual therapy today to see if this could help, I do not want to change much since she will have MRI soon, she is very tight with significant knots in the upper trap, scalenes and SCM mms, I did some PROM with stretching of the neck trying to stretch these mms, she reported feeling much better at the end but I would like to see if she gets some carry over   ? PT Next Visit Plan Assess thoracic mobility tolerance. Pect minor stretching.   ? Consulted and Agree with Plan of Care Patient   ? ?  ?  ? ?  ? ? ?Patient will benefit from skilled therapeutic intervention in order to improve the following deficits and impairments:  Decreased range of motion, Increased fascial restricitons, Pain, Decreased mobility, Decreased strength, Improper body mechanics, Decreased activity tolerance, Postural dysfunction, Impaired flexibility, Increased muscle spasms ? ?Visit Diagnosis: ?Cervicalgia ? ?Radiculopathy, cervical region ? ?Muscle weakness (generalized) ? ?Cramp and spasm ? ? ? ? ?Problem  List ?Patient Active Problem List  ? Diagnosis Date Noted  ? Dependence on CPAP ventilation 04/11/2021  ? Obesity (BMI 35.0-39.9 without comorbidity) 04/11/2021  ? History of mononucleosis 05/09/2017  ? Depression 05/09/2017  ? OSA on CPAP 05/09/2017  ? Chronic fatigue 05/09/2017  ? Hypothyroidism due to Hashimoto's thyroiditis 05/09/2017  ? Class 2 severe obesity with serious comorbidity and body mass index (BMI) of 37.0 to 37.9 in adult [facility]) 05/09/2017  ? Pain in joint, ankle and foot 07/10/2013  ? Excessive sleepiness 07/03/2012  ? Goiter 07/03/2012  ? Visit for preventive health examination 07/03/2012  ? Plantar fasciitis of right foot 04/16/2012  ? Foreign travel 04/09/2012  ? OTHER SPECIFIED DISORDER OF SKIN 01/28/2010  ? CARPAL TUNNEL SYNDROME, RIGHT 10/22/2009  ? IRON DEFICIENCY 07/28/2009  ? DIARRHEA 12/31/2008  ? OTHER SPECIFIED DISORDER OF RECTUM AND ANUS 07/24/2008  ? PRURITUS, ANAL 07/24/2008  ? SHOULDER PAIN, LEFT 01/24/2008  ? OBESITY 04/19/2007  ? LOW BACK PAIN, CHRONIC 03/13/2007  ? OBSTRUCTIVE SLEEP APNEA 02/26/2007  ? FATIGUE 01/04/2007  ? SYMPTOM, ENLARGEMENT, LYMPH NODES 12/20/2006  ? INTERTRIGO, CANDIDAL 11/16/2006  ? ABNORMAL RESULT, FUNCTION STUDY, THYROID 11/16/2006  ? DEPRESSION 09/06/2006  ? ALLERGIC RHINITIS 09/06/2006  ? ENDOMETRIOSIS NOS 09/06/2006  ? ? Sumner Boast, PT ?04/27/2021, 9:23 AM ? ?[place] [address] ?[place], [place], [address] ?Phone: [phone]   Fax:  [phone] ? ?Name: ALAYSIA LIGHTLE ?MRN: [id number] ?Date of Birth: 10-25-1965 ? ? ? ?

## 2021-05-03 ENCOUNTER — Ambulatory Visit: Payer: 59 | Admitting: Physical Therapy

## 2021-05-03 ENCOUNTER — Other Ambulatory Visit: Payer: Self-pay

## 2021-05-03 ENCOUNTER — Encounter: Payer: Self-pay | Admitting: Physical Therapy

## 2021-05-03 DIAGNOSIS — M542 Cervicalgia: Secondary | ICD-10-CM

## 2021-05-03 DIAGNOSIS — R252 Cramp and spasm: Secondary | ICD-10-CM

## 2021-05-03 DIAGNOSIS — R2689 Other abnormalities of gait and mobility: Secondary | ICD-10-CM

## 2021-05-03 DIAGNOSIS — M5412 Radiculopathy, cervical region: Secondary | ICD-10-CM

## 2021-05-03 DIAGNOSIS — M6281 Muscle weakness (generalized): Secondary | ICD-10-CM

## 2021-05-03 NOTE — Therapy (Signed)
[place] ?[place] ?[address] ?[place], [place], [address] ?Phone: [phone]   Fax:  [phone] ? ?Physical Therapy Treatment ? ?Patient Details  ?Name: Maria Terrell ?MRN: [id number] ?Date of Birth: 08-19-1965 ?Referring Provider (PT): Conley Rolls ? ? ?Encounter Date: 05/03/2021 ? ? PT End of Session - 05/03/21 1019   ? ? Visit Number 9   ? Date for PT Re-Evaluation 07/03/21   ? Authorization Type [employer]   ? PT Start Time [phone]   ? PT Stop Time 1015   ? PT Time Calculation (min) 44 min   ? Activity Tolerance Patient tolerated treatment well   ? Behavior During Therapy [facility] for tasks assessed/performed   ? ?  ?  ? ?  ? ? ?Past Medical History:  ?Diagnosis Date  ? Allergy   ? Constipation   ? after gastric surgery having issues   ? Depression   ? Endometriosis   ? Sleep apnea   ? wears cpap  ? Thyroid goiter   ? ? ?Past Surgical History:  ?Procedure Laterality Date  ? BIOPSY THYROID    ? BREAST BIOPSY Left   ? BREAST BIOPSY Right   ? CHOLECYSTECTOMY  1999  ? COLONOSCOPY    ? 15 yrs ago  ? LAPAROSCOPIC GASTRIC BAND REMOVAL WITH LAPAROSCOPIC GASTRIC SLEEVE RESECTION  12/2014  ? LAPAROTOMY    ? ? ?There were no vitals filed for this visit. ? ? Subjective Assessment - 05/03/21 0934   ? ? Subjective Patient reports she felt much more loose after last treatment, but sore to the touch. Looseness last 2-3 days, but her tightness returned. MRI 3/15   ? Limitations House hold activities;Reading   ? How long can you sit comfortably? n/a   ? How long can you stand comfortably? n/a more limited by back   ? How long can you walk comfortably? ~20 minutes (lap around neighborhood; usually able to do 2 laps)   ? Patient Stated Goals have less pain, less affects of pain and tingling on my life   ? Currently in Pain? Yes   ? Pain Score 3    ? Pain Location Neck   ? Pain Orientation Right;Left   ? Pain Descriptors / Indicators Tightness   ? Pain Type Chronic pain   ? Pain Onset More  than a month ago   ? Pain Frequency Intermittent   ? ?  ?  ? ?  ? ? ? ? ? ? ? ? ? ? ? ? ? ? ? ? ? ? ? ? [place] Adult PT Treatment/Exercise - 05/03/21 0001   ? ?  ? Neck Exercises: Seated  ? Other Seated Exercise Seated, hands in ER, behind hips, push through hands, squeeze shoulder blades, hold x 5, 5 reps   ?  ? Manual Therapy  ? Manual Therapy Soft tissue mobilization;Scapular mobilization;Passive ROM;Neural Stretch   ? Soft tissue mobilization cervical paraspinals, Up traps, rhomboids, Pect minor-B for all.   ? Passive ROM thoracic spine   ? Manual Traction occipital release, B scapular mobilization   ? Neural Stretch brachial plexus nerve glides   ? ?  ?  ? ?  ? ? ? ? ? ? ? ? ? ? ? ? ? ? ? PT Long Term Goals - 04/27/21 0922   ? ?  ? PT LONG TERM GOAL #1  ? Title Pt will be independent with advanced HEP   ? Status Partially Met   ?  ?  PT LONG TERM GOAL #2  ? Title report 50% less numbness   ? Status On-going   ?  ? PT LONG TERM GOAL #3  ? Title increase right grip strength to 6# more than the left   ? Status On-going   ?  ? PT LONG TERM GOAL #4  ? Title understand proper posture and body mechanics   ? Status Achieved   ? ?  ?  ? ?  ? ? ? ? ? ? ? ? Plan - 05/03/21 1000   ? ? Clinical Impression Statement Continued with more Man Therapy as patient reports she did get relief that last several days. Added back some postural stabilizing exercises and emphasized importance  HEP for strength and postural control.   ? Personal Factors and Comorbidities Age;Fitness;Time since onset of injury/illness/exacerbation   ? Examination-Activity Limitations Locomotion Level;Transfers;Stairs;Squat;Caring for Others;Carry;Lift   ? Examination-Participation Restrictions Community Activity;Shop   ? Clinical Decision Making Low   ? Rehab Potential Good   ? PT Frequency 2x / week   ? PT Duration Other (comment)   5w  ? PT Treatment/Interventions ADLs/Self Care Home Management;Cryotherapy;Electrical Stimulation;Iontophoresis 4mg /ml  Dexamethasone;Therapeutic activities;Therapeutic exercise;Balance training;Neuromuscular re-education;Manual techniques;Patient/family education;Dry needling;Taping;Traction;Moist Heat   ? PT Next Visit Plan Continue with STM, stretch, joint mobs   ? PT [place]   ? Consulted and Agree with Plan of Care Patient   ? ?  ?  ? ?  ? ? ?Patient will benefit from skilled therapeutic intervention in order to improve the following deficits and impairments:  Decreased range of motion, Increased fascial restricitons, Pain, Decreased mobility, Decreased strength, Improper body mechanics, Decreased activity tolerance, Postural dysfunction, Impaired flexibility, Increased muscle spasms ? ?Visit Diagnosis: ?Cervicalgia ? ?Radiculopathy, cervical region ? ?Muscle weakness (generalized) ? ?Cramp and spasm ? ?Other abnormalities of gait and mobility ? ? ? ? ?Problem List ?Patient Active Problem List  ? Diagnosis Date Noted  ? Dependence on CPAP ventilation 04/11/2021  ? Obesity (BMI 35.0-39.9 without comorbidity) 04/11/2021  ? History of mononucleosis 05/09/2017  ? Depression 05/09/2017  ? OSA on CPAP 05/09/2017  ? Chronic fatigue 05/09/2017  ? Hypothyroidism due to Hashimoto's thyroiditis 05/09/2017  ? Class 2 severe obesity with serious comorbidity and body mass index (BMI) of 37.0 to 37.9 in adult [facility]) 05/09/2017  ? Pain in joint, ankle and foot 07/10/2013  ? Excessive sleepiness 07/03/2012  ? Goiter 07/03/2012  ? Visit for preventive health examination 07/03/2012  ? Plantar fasciitis of right foot 04/16/2012  ? Foreign travel 04/09/2012  ? OTHER SPECIFIED DISORDER OF SKIN 01/28/2010  ? CARPAL TUNNEL SYNDROME, RIGHT 10/22/2009  ? IRON DEFICIENCY 07/28/2009  ? DIARRHEA 12/31/2008  ? OTHER SPECIFIED DISORDER OF RECTUM AND ANUS 07/24/2008  ? PRURITUS, ANAL 07/24/2008  ? SHOULDER PAIN, LEFT 01/24/2008  ? OBESITY 04/19/2007  ? LOW BACK PAIN, CHRONIC 03/13/2007  ? OBSTRUCTIVE SLEEP APNEA 02/26/2007  ? FATIGUE  01/04/2007  ? SYMPTOM, ENLARGEMENT, LYMPH NODES 12/20/2006  ? INTERTRIGO, CANDIDAL 11/16/2006  ? ABNORMAL RESULT, FUNCTION STUDY, THYROID 11/16/2006  ? DEPRESSION 09/06/2006  ? ALLERGIC RHINITIS 09/06/2006  ? ENDOMETRIOSIS NOS 09/06/2006  ? ? ?Marcelina Morel, DPT ?05/03/2021, 12:11 PM ? ?[place] ?[place] ?[address] ?[place], [place], [address] ?Phone: [phone]   Fax:  [phone] ? ?Name: Maria Terrell ?MRN: [id number] ?Date of Birth: [birth year] ? ? ? ?

## 2021-05-03 NOTE — Patient Instructions (Signed)
Access Code: [email or web address] ?URL: [email or web address] ?Date: 05/03/2021 ?Prepared by: Oley Balm ? ?Exercises ?Seated Thoracic Flexion and Rotation with Arms Crossed - 1 x daily - 7 x weekly - 3 reps - 15 hold ?Quadruped Thoracic Rotation with Hand on Neck - 1 x daily - 7 x weekly - 3 reps - 15 hold ?Quadruped Alternating Arm Lift - 1 x daily - 7 x weekly - 2 sets - 10 reps ?Quadruped Alternating Leg Extensions - 1 x daily - 7 x weekly - 2 sets - 10 reps ?Bird Dog - 1 x daily - 7 x weekly - 2 sets - 10 reps ?Doorway Pec Stretch at 60 Elevation - 1 x daily - 7 x weekly - 3 reps - 15 hold ?Doorway Pec Stretch at 120 Degrees Abduction - 1 x daily - 7 x weekly - 3 reps - 15 hold ?Supine Thoracic Mobilization Towel Roll Vertical - 1 x daily - 7 x weekly - 3 sets - 10 reps ?Shoulder Extension with Resistance - 1 x daily - 7 x weekly - 2 sets - 10 reps ?Standing Bilateral Low Shoulder Row with Anchored Resistance - 1 x daily - 7 x weekly - 2 sets - 10 reps ?Shoulder External Rotation and Scapular Retraction with Resistance - 1 x daily - 7 x weekly - 2 sets - 10 reps ?Standing Isometric Cervical Retraction with Chin Tucks and Ball at [employer] - 1 x daily - 7 x weekly - 2 sets - 10 reps ? ?

## 2021-05-05 ENCOUNTER — Other Ambulatory Visit: Payer: Self-pay

## 2021-05-05 ENCOUNTER — Ambulatory Visit: Payer: 59 | Admitting: Physical Therapy

## 2021-05-05 DIAGNOSIS — M5412 Radiculopathy, cervical region: Secondary | ICD-10-CM

## 2021-05-05 DIAGNOSIS — M6281 Muscle weakness (generalized): Secondary | ICD-10-CM

## 2021-05-05 DIAGNOSIS — M542 Cervicalgia: Secondary | ICD-10-CM

## 2021-05-05 NOTE — Therapy (Signed)
[place] ?[place] ?[address] ?[place], [place], [address] ?Phone: [phone]   Fax:  [phone] ? ?Physical Therapy Treatment ? ?Patient Details  ?Name: Maria Terrell ?MRN: [id number] ?Date of Birth: 1965/07/08 ?Referring Provider (PT): Conley Rolls ? ? ?Encounter Date: 05/05/2021 ? ? PT End of Session - 05/05/21 1659   ? ? Visit Number 10   ? Number of Visits 12   ? Date for PT Re-Evaluation 07/03/21   ? Authorization Type [employer]   ? PT Start Time 1615   ? PT Stop Time 1710   ? PT Time Calculation (min) 55 min   ? ?  ?  ? ?  ? ? ?Past Medical History:  ?Diagnosis Date  ? Allergy   ? Constipation   ? after gastric surgery having issues   ? Depression   ? Endometriosis   ? Sleep apnea   ? wears cpap  ? Thyroid goiter   ? ? ?Past Surgical History:  ?Procedure Laterality Date  ? BIOPSY THYROID    ? BREAST BIOPSY Left   ? BREAST BIOPSY Right   ? CHOLECYSTECTOMY  1999  ? COLONOSCOPY    ? 15 yrs ago  ? LAPAROSCOPIC GASTRIC BAND REMOVAL WITH LAPAROSCOPIC GASTRIC SLEEVE RESECTION  12/2014  ? LAPAROTOMY    ? ? ?There were no vitals filed for this visit. ? ? Subjective Assessment - 05/05/21 1620   ? ? Subjective " normal pain" denies N/T but states tightness in forearms. states not much carry over with relief from tx   ? Currently in Pain? Yes   ? Pain Score 3    ? Pain Location Neck   ? ?  ?  ? ?  ? ? ? ? ? ? ? ? ? ? ? ? ? ? ? ? ? ? ? ? [facility] Adult PT Treatment/Exercise - 05/05/21 0001   ? ?  ? Neck Exercises: Machines for Strengthening  ? UBE (Upper Arm Bike) L 3 2 min fwd/2 min backward   ?  ? Neck Exercises: Theraband  ? Other Theraband Exercises 3 pt rhy stab 10 each   ?  ? Neck Exercises: Standing  ? Neck Retraction 10 reps   with head on ball and  3 # multi U Eex  ? Other Standing Exercises shld ext 3# 15 x   ?  ? Traction  ? Type of Traction Cervical   ? Max (lbs) 15   ? Hold Time 10   ? Time 10   ?  ? Manual Therapy  ? Manual Therapy Soft tissue  mobilization;Myofascial release;Passive ROM;Manual Traction   ? Manual therapy comments cerv retraction with distratcion   ? Soft tissue mobilization cervical paraspinals, Up traps, rhomboids, Pect minor-B for all.   ? Myofascial Release cerv with end range stretch for rotation and SB   ? Passive ROM thoracic spine   ? Manual Traction occipital release   ? ?  ?  ? ?  ? ? ? ? ? ? ? ? ? ? ? ? ? ? ? PT Long Term Goals - 05/05/21 1659   ? ?  ? PT LONG TERM GOAL #1  ? Title Pt will be independent with advanced HEP   ? Status Partially Met   ?  ? PT LONG TERM GOAL #2  ? Title report 50% less numbness   ? Baseline varies with positions   ? Status Partially Met   ?  ?  PT LONG TERM GOAL #4  ? Title understand proper posture and body mechanics   ? Status Achieved   ?  ? PT LONG TERM GOAL #5  ? Title independent and safe at the gym   ? Baseline Has not yet returned.   ? Status On-going   ? ?  ?  ? ?  ? ? ? ? ? ? ? ? Plan - 05/05/21 1700   ? ? Clinical Impression Statement cerv and scap stab ex performed with cuing. MT to cerv and thor with stretching and ROM- pt is very tight with minimal mvmt in cerv/thor area. added back in tratcion for prolonged stretch. MRI next week   ? PT Treatment/Interventions ADLs/Self Care Home Management;Cryotherapy;Electrical Stimulation;Iontophoresis 4mg /ml Dexamethasone;Therapeutic activities;Therapeutic exercise;Balance training;Neuromuscular re-education;Manual techniques;Patient/family education;Dry needling;Taping;Traction;Moist Heat   ? PT Next Visit Plan Continue with STM, stretch, joint mobs   ? ?  ?  ? ?  ? ? ?Patient will benefit from skilled therapeutic intervention in order to improve the following deficits and impairments:  Decreased range of motion, Increased fascial restricitons, Pain, Decreased mobility, Decreased strength, Improper body mechanics, Decreased activity tolerance, Postural dysfunction, Impaired flexibility, Increased muscle spasms ? ?Visit  Diagnosis: ?Cervicalgia ? ?Radiculopathy, cervical region ? ?Muscle weakness (generalized) ? ? ? ? ?Problem List ?Patient Active Problem List  ? Diagnosis Date Noted  ? Dependence on CPAP ventilation 04/11/2021  ? Obesity (BMI 35.0-39.9 without comorbidity) 04/11/2021  ? History of mononucleosis 05/09/2017  ? Depression 05/09/2017  ? OSA on CPAP 05/09/2017  ? Chronic fatigue 05/09/2017  ? Hypothyroidism due to Hashimoto's thyroiditis 05/09/2017  ? Class 2 severe obesity with serious comorbidity and body mass index (BMI) of 37.0 to 37.9 in adult [facility]) 05/09/2017  ? Pain in joint, ankle and foot 07/10/2013  ? Excessive sleepiness 07/03/2012  ? Goiter 07/03/2012  ? Visit for preventive health examination 07/03/2012  ? Plantar fasciitis of right foot 04/16/2012  ? Foreign travel 04/09/2012  ? OTHER SPECIFIED DISORDER OF SKIN 01/28/2010  ? CARPAL TUNNEL SYNDROME, RIGHT 10/22/2009  ? IRON DEFICIENCY 07/28/2009  ? DIARRHEA 12/31/2008  ? OTHER SPECIFIED DISORDER OF RECTUM AND ANUS 07/24/2008  ? PRURITUS, ANAL 07/24/2008  ? SHOULDER PAIN, LEFT 01/24/2008  ? OBESITY 04/19/2007  ? LOW BACK PAIN, CHRONIC 03/13/2007  ? OBSTRUCTIVE SLEEP APNEA 02/26/2007  ? FATIGUE 01/04/2007  ? SYMPTOM, ENLARGEMENT, LYMPH NODES 12/20/2006  ? INTERTRIGO, CANDIDAL 11/16/2006  ? ABNORMAL RESULT, FUNCTION STUDY, THYROID 11/16/2006  ? DEPRESSION 09/06/2006  ? ALLERGIC RHINITIS 09/06/2006  ? ENDOMETRIOSIS NOS 09/06/2006  ? ? ?Maria Terrell,Maria Terrell, Maria Terrell ?05/05/2021, 5:06 PM ? ?[place] ?[place] [address] ?[place], [place], [address] ?Phone: [phone]   Fax:  [phone] ? ?Name: Maria Terrell ?MRN: [id number] ?Date of Birth: Aug 17, 1965 ? ? ? ?

## 2021-05-09 ENCOUNTER — Ambulatory Visit: Payer: 59 | Admitting: Physical Therapy

## 2021-05-11 ENCOUNTER — Ambulatory Visit: Payer: 59 | Admitting: Physical Therapy

## 2021-05-12 ENCOUNTER — Ambulatory Visit: Payer: 59 | Admitting: Physical Therapy

## 2021-05-24 ENCOUNTER — Other Ambulatory Visit: Payer: Self-pay

## 2021-05-24 ENCOUNTER — Ambulatory Visit: Payer: 59 | Admitting: Physical Therapy

## 2021-05-24 DIAGNOSIS — M5412 Radiculopathy, cervical region: Secondary | ICD-10-CM

## 2021-05-24 DIAGNOSIS — M542 Cervicalgia: Secondary | ICD-10-CM

## 2021-05-24 DIAGNOSIS — M6281 Muscle weakness (generalized): Secondary | ICD-10-CM

## 2021-05-24 NOTE — Therapy (Signed)
[place] ?[place] ?[address] ?[place], [place], [address] ?Phone: [phone]   Fax:  [phone] ? ?Physical Therapy Treatment ? ?Patient Details  ?Name: Maria Terrell ?MRN: [id number] ?Date of Birth: [birth year] ?Referring Provider (PT): Conley Rolls ? ? ?Encounter Date: 05/24/2021 ? ? ? ?Past Medical History:  ?Diagnosis Date  ? Allergy   ? Constipation   ? after gastric surgery having issues   ? Depression   ? Endometriosis   ? Sleep apnea   ? wears cpap  ? Thyroid goiter   ? ? ?Past Surgical History:  ?Procedure Laterality Date  ? BIOPSY THYROID    ? BREAST BIOPSY Left   ? BREAST BIOPSY Right   ? CHOLECYSTECTOMY  1999  ? COLONOSCOPY    ? 15 yrs ago  ? LAPAROSCOPIC GASTRIC BAND REMOVAL WITH LAPAROSCOPIC GASTRIC SLEEVE RESECTION  12/2014  ? LAPAROTOMY    ? ? ?There were no vitals filed for this visit. ? ? Subjective Assessment - 05/24/21 0842   ? ? Subjective MRI shows 3 buldging disc. continue PT, try meds or injection - not sure what to do.   ? ?  ?  ? ?  ? ? ? ? ? ? ? ? ? ? ? ? ? ? ? ? ? ? ? ? [place] Adult PT Treatment/Exercise - 05/24/21 0001   ? ?  ? Neck Exercises: Machines for Strengthening  ? UBE (Upper Arm Bike) L 3 3 min fwd/3 min backward   ? Cybex Row 35# 2 sets 10   ? Lat Pull 35# 2 x 10 reps.   ?  ? Neck Exercises: Standing  ? Other Standing Exercises cable pulley 10 # shld ext 2 sets 10   ? Other Standing Exercises red tband 3 pt scap stab 10 each   ?  ? Neck Exercises: Seated  ? Other Seated Exercise shld ext,row and horz abd 2 sets 10  4#   ? Other Seated Exercise 4# ER 10 x arms at side 10 x PNF   ?  ? Traction  ? Type of Traction Cervical   ? Max (lbs) 15   ? Time 12   ? ?  ?  ? ?  ? ? ? ? ? ? ? ? ? ? ? ? ? ? ? PT Long Term Goals - 05/05/21 1659   ? ?  ? PT LONG TERM GOAL #1  ? Title Pt will be independent with advanced HEP   ? Status Partially Met   ?  ? PT LONG TERM GOAL #2  ? Title report 50% less numbness   ? Baseline varies with  positions   ? Status Partially Met   ?  ? PT LONG TERM GOAL #4  ? Title understand proper posture and body mechanics   ? Status Achieved   ?  ? PT LONG TERM GOAL #5  ? Title independent and safe at the gym   ? Baseline Has not yet returned.   ? Status On-going   ? ?  ?  ? ?  ? ? ? ? ? ? ? ? Plan - 05/24/21 0858   ? ? Clinical Impression Statement discussed at length with pt diagnosis and maintainance of disc, options for tx at home ie tratcion and TENS unit. discussed injection. stressed need to continue strengthen of cerv/postural muscles and she agreed. reviewed HEPS and pt demo well. discussed ordering tratcion form [employer] and trying husbands TENS. plan  to [facility]OLS after next session   ? PT Treatment/Interventions ADLs/Self Care Home Management;Cryotherapy;Electrical Stimulation;Iontophoresis 4mg/ml Dexamethasone;Therapeutic activities;Therapeutic exercise;Balance training;Neuromuscular re-education;Manual techniques;Patient/family education;Dry needling;Taping;Traction;Moist Heat   ? PT Next Visit Plan check goals and HOLD after next session   ? ?  ?  ? ?  ? ? ?Patient will benefit from skilled therapeutic intervention in order to improve the following deficits and impairments:    ? ?Visit Diagnosis: ?Cervicalgia ? ?Radiculopathy, cervical region ? ?Muscle weakness (generalized) ? ? ? ? ?Problem List ?Patient Active Problem List  ? Diagnosis Date Noted  ? Dependence on CPAP ventilation 04/11/2021  ? Obesity (BMI 35.0-39.9 without comorbidity) 04/11/2021  ? History of mononucleosis 05/09/2017  ? Depression 05/09/2017  ? OSA on CPAP 05/09/2017  ? Chronic fatigue 05/09/2017  ? Hypothyroidism due to Hashimoto's thyroiditis 05/09/2017  ? Class 2 severe obesity with serious comorbidity and body mass index (BMI) of 37.0 to 37.9 in adult (HCC) 05/09/2017  ? Pain in joint, ankle and foot 07/10/2013  ? Excessive sleepiness 07/03/2012  ? Goiter 07/03/2012  ? Visit for preventive health examination 07/03/2012  ? Plantar  fasciitis of right foot 04/16/2012  ? Foreign travel 04/09/2012  ? OTHER SPECIFIED DISORDER OF SKIN 01/28/2010  ? CARPAL TUNNEL SYNDROME, RIGHT 10/22/2009  ? IRON DEFICIENCY 07/28/2009  ? DIARRHEA 12/31/2008  ? OTHER SPECIFIED DISORDER OF RECTUM AND ANUS 07/24/2008  ? PRURITUS, ANAL 07/24/2008  ? SHOULDER PAIN, LEFT 01/24/2008  ? OBESITY 04/19/2007  ? LOW BACK PAIN, CHRONIC 03/13/2007  ? OBSTRUCTIVE SLEEP APNEA 02/26/2007  ? FATIGUE 01/04/2007  ? SYMPTOM, ENLARGEMENT, LYMPH NODES 12/20/2006  ? INTERTRIGO, CANDIDAL 11/16/2006  ? ABNORMAL RESULT, FUNCTION STUDY, THYROID 11/16/2006  ? DEPRESSION 09/06/2006  ? ALLERGIC RHINITIS 09/06/2006  ? ENDOMETRIOSIS NOS 09/06/2006  ? ? ?PAYSEUR,ANGIE, PTA ?05/24/2021, 9:16 AM ? ?[place] ?[facility] ?[address] ?[place], [place], [address] ?Phone: [phone]   Fax:  [phone] ? ?Name: Maria Terrell ?MRN: [id number] ?Date of Birth: 07/16/1965 ? ? ? ?

## 2021-05-25 ENCOUNTER — Encounter: Payer: 59 | Admitting: Physical Therapy

## 2021-05-27 ENCOUNTER — Encounter: Payer: Self-pay | Admitting: Physical Therapy

## 2021-05-27 ENCOUNTER — Ambulatory Visit: Payer: 59 | Admitting: Physical Therapy

## 2021-05-27 DIAGNOSIS — M542 Cervicalgia: Secondary | ICD-10-CM

## 2021-05-27 DIAGNOSIS — R252 Cramp and spasm: Secondary | ICD-10-CM

## 2021-05-27 DIAGNOSIS — M6281 Muscle weakness (generalized): Secondary | ICD-10-CM

## 2021-05-27 DIAGNOSIS — M5412 Radiculopathy, cervical region: Secondary | ICD-10-CM

## 2021-05-27 NOTE — Therapy (Signed)
[place] ?[place] ?[address] ?[place], [place], [address] ?Phone: [phone]   Fax:  [phone] ? ?Physical Therapy Treatment ? ?Patient Details  ?Name: Maria Terrell ?MRN: [id number] ?Date of Birth: February 16, 1966 ?Referring Provider (PT): Conley Rolls ? ? ?Encounter Date: 05/27/2021 ? ? PT End of Session - 05/27/21 5573   ? ? Visit Number 11   ? PT Start Time 0848   ? PT Stop Time [phone]   ? PT Time Calculation (min) 35 min   ? Activity Tolerance Patient tolerated treatment well   ? Behavior During Therapy [facility] for tasks assessed/performed   ? ?  ?  ? ?  ? ? ?Past Medical History:  ?Diagnosis Date  ? Allergy   ? Constipation   ? after gastric surgery having issues   ? Depression   ? Endometriosis   ? Sleep apnea   ? wears cpap  ? Thyroid goiter   ? ? ?Past Surgical History:  ?Procedure Laterality Date  ? BIOPSY THYROID    ? BREAST BIOPSY Left   ? BREAST BIOPSY Right   ? CHOLECYSTECTOMY  1999  ? COLONOSCOPY    ? 15 yrs ago  ? LAPAROSCOPIC GASTRIC BAND REMOVAL WITH LAPAROSCOPIC GASTRIC SLEEVE RESECTION  12/2014  ? LAPAROTOMY    ? ? ?There were no vitals filed for this visit. ? ? Subjective Assessment - 05/27/21 0849   ? ? Subjective Patient reports that she did purchase a home traction device. She has used it twice, but is unsure if it is helping.   ? ?  ?  ? ?  ? ? ? ? ? ? ? ? ? ? ? ? ? ? ? ? ? ? ? ? ? ? ? ? ? ? ? ? ? ? ? ? ? ? PT Long Term Goals - 05/27/21 0902   ? ?  ? PT LONG TERM GOAL #1  ? Title Pt will be independent with advanced HEP   ? Status Achieved   ?  ? PT LONG TERM GOAL #2  ? Title report 50% less numbness   ? Baseline varies with positions   ? Status Achieved   ?  ? PT LONG TERM GOAL #3  ? Title increase right grip strength to 6# more than the left   ? Baseline R 30#, L 34#   ? Status Not Met   ?  ? PT LONG TERM GOAL #4  ? Title understand proper posture and body mechanics   ? Status Achieved   ?  ? PT LONG TERM GOAL #5  ? Title independent and safe  at the gym   ? Baseline Patient able to transistion exercises to gym equipment.   ? Status Achieved   ? ?  ?  ? ?  ? ? ? ? ? ? ? ? Plan - 05/27/21 0908   ? ? Clinical Impression Statement Patient reports that she did obtain the traction and has used it twice. She has not tried her husband's TNS yet. Re-assessed status for D/C. Continued to educate patient to use of gym equipment.   ? Personal Factors and Comorbidities Age;Fitness;Time since onset of injury/illness/exacerbation   ? Examination-Activity Limitations Locomotion Level;Transfers;Stairs;Squat;Caring for Others;Carry;Lift   ? Stability/Clinical Decision Making Stable/Uncomplicated   ? Clinical Decision Making Low   ? Rehab Potential Good   ? PT Frequency 2x / week   ? PT Treatment/Interventions ADLs/Self Care Home Management;Cryotherapy;Electrical Stimulation;Iontophoresis 4mg /ml Dexamethasone;Therapeutic activities;Therapeutic exercise;Balance  training;Neuromuscular re-education;Manual techniques;Patient/family education;Dry needling;Taping;Traction;Moist Heat   ? PT [place]   ? Consulted and Agree with Plan of Care Patient   ? ?  ?  ? ?  ? ? ?Patient will benefit from skilled therapeutic intervention in order to improve the following deficits and impairments:  Decreased range of motion, Increased fascial restricitons, Pain, Decreased mobility, Decreased strength, Improper body mechanics, Decreased activity tolerance, Postural dysfunction, Impaired flexibility, Increased muscle spasms ? ?Visit Diagnosis: ?Cervicalgia ? ?Radiculopathy, cervical region ? ?Muscle weakness (generalized) ? ?Cramp and spasm ? ? ? ? ?Problem List ?Patient Active Problem List  ? Diagnosis Date Noted  ? Dependence on CPAP ventilation 04/11/2021  ? Obesity (BMI 35.0-39.9 without comorbidity) 04/11/2021  ? History of mononucleosis 05/09/2017  ? Depression 05/09/2017  ? OSA on CPAP 05/09/2017  ? Chronic fatigue 05/09/2017  ? Hypothyroidism due to Hashimoto's  thyroiditis 05/09/2017  ? Class 2 severe obesity with serious comorbidity and body mass index (BMI) of 37.0 to 37.9 in adult [facility]) 05/09/2017  ? Pain in joint, ankle and foot 07/10/2013  ? Excessive sleepiness 07/03/2012  ? Goiter 07/03/2012  ? Visit for preventive health examination 07/03/2012  ? Plantar fasciitis of right foot 04/16/2012  ? Foreign travel 04/09/2012  ? OTHER SPECIFIED DISORDER OF SKIN 01/28/2010  ? CARPAL TUNNEL SYNDROME, RIGHT 10/22/2009  ? IRON DEFICIENCY 07/28/2009  ? DIARRHEA 12/31/2008  ? OTHER SPECIFIED DISORDER OF RECTUM AND ANUS 07/24/2008  ? PRURITUS, ANAL 07/24/2008  ? SHOULDER PAIN, LEFT 01/24/2008  ? OBESITY 04/19/2007  ? LOW BACK PAIN, CHRONIC 03/13/2007  ? OBSTRUCTIVE SLEEP APNEA 02/26/2007  ? FATIGUE 01/04/2007  ? SYMPTOM, ENLARGEMENT, LYMPH NODES 12/20/2006  ? INTERTRIGO, CANDIDAL 11/16/2006  ? ABNORMAL RESULT, FUNCTION STUDY, THYROID 11/16/2006  ? DEPRESSION 09/06/2006  ? ALLERGIC RHINITIS 09/06/2006  ? ENDOMETRIOSIS NOS 09/06/2006  ? ? ?Marcelina Morel, DPT ?05/27/2021, 9:23 AM ? ?[place] ?[place] [address] ?[place], [place], [address] ?Phone: [phone]   Fax:  [phone] ? ?Name: TYAISHA CULLOM ?MRN: [id number] ?Date of Birth: [birth year] ? ? ? ?

## 2021-05-30 ENCOUNTER — Telehealth: Payer: Self-pay

## 2021-05-30 NOTE — Telephone Encounter (Signed)
LVM for pt to call me back to schedule sleep study  

## 2021-06-08 ENCOUNTER — Telehealth: Payer: Self-pay

## 2021-06-08 NOTE — Telephone Encounter (Signed)
LVM for pt to call me back to schedule sleep study  

## 2021-06-27 ENCOUNTER — Telehealth: Payer: Self-pay

## 2021-06-27 NOTE — Telephone Encounter (Signed)
We have attempted to call the patient 2 times to schedule sleep study. Patient has been unavailable at the phone numbers we have on file and has not returned our calls. If patient calls back we will schedule them for their sleep study. ° °

## 2021-10-10 ENCOUNTER — Ambulatory Visit: Payer: PRIVATE HEALTH INSURANCE | Admitting: Neurology

## 2021-10-19 ENCOUNTER — Ambulatory Visit: Payer: PRIVATE HEALTH INSURANCE | Admitting: Neurology

## 2021-11-30 ENCOUNTER — Other Ambulatory Visit: Payer: Self-pay | Admitting: Family Medicine

## 2021-11-30 DIAGNOSIS — R748 Abnormal levels of other serum enzymes: Secondary | ICD-10-CM

## 2021-12-06 ENCOUNTER — Ambulatory Visit
Admission: RE | Admit: 2021-12-06 | Discharge: 2021-12-06 | Disposition: A | Discharge status: Home / Self Care | Payer: 59 | Source: Ambulatory Visit | Attending: Family Medicine | Admitting: Family Medicine

## 2021-12-06 DIAGNOSIS — R748 Abnormal levels of other serum enzymes: Secondary | ICD-10-CM

## 2022-12-01 ENCOUNTER — Encounter: Payer: Self-pay | Admitting: Obstetrics and Gynecology

## 2022-12-01 ENCOUNTER — Ambulatory Visit (INDEPENDENT_AMBULATORY_CARE_PROVIDER_SITE_OTHER): Payer: 59 | Admitting: Obstetrics and Gynecology

## 2022-12-01 VITALS — BP 106/75 | HR 91 | Ht 66.93 in | Wt 248.0 lb

## 2022-12-01 DIAGNOSIS — L900 Lichen sclerosus et atrophicus: Secondary | ICD-10-CM | POA: Diagnosis not present

## 2022-12-01 DIAGNOSIS — N941 Unspecified dyspareunia: Secondary | ICD-10-CM

## 2022-12-01 DIAGNOSIS — R350 Frequency of micturition: Secondary | ICD-10-CM

## 2022-12-01 LAB — POCT URINALYSIS DIPSTICK
Bilirubin, UA: NEGATIVE
Blood, UA: NEGATIVE
Glucose, UA: NEGATIVE
Ketones, UA: NEGATIVE
Leukocytes, UA: NEGATIVE
Nitrite, UA: NEGATIVE
Protein, UA: NEGATIVE
Spec Grav, UA: 1.01 (ref 1.010–1.025)
Urobilinogen, UA: 0.2 U/dL
pH, UA: 6 (ref 5.0–8.0)

## 2022-12-01 MED ORDER — CLOBETASOL PROPIONATE E 0.05 % EX CREA: 1.0000 g | TOPICAL_CREAM | Freq: Every evening | CUTANEOUS | 11 refills | Status: DC

## 2022-12-01 MED ORDER — LIDOCAINE-PRILOCAINE 2.5-2.5 % EX CREA: 1.0000 | TOPICAL_CREAM | CUTANEOUS | 5 refills | Status: AC | PRN

## 2022-12-01 NOTE — Progress Notes (Signed)
[facility] New Patient Evaluation and Consultation  Referring Provider: Sherian Rein, * PCP: Aliene Beams, MD Date of Service: 12/01/2022  SUBJECTIVE Chief Complaint: New Patient (Initial Visit) Maria Terrell is a 57 y.o. female here for a consult for painful vagina and intercourse)  History of Present Illness: Maria Terrell is a 57 y.o. White or Caucasian female seen in consultation at the request of Dr. Hinton Rao for evaluation of atrophy and dyspareunia.    Review of records significant for: Has vaginal dryness and dyspareunia. Tried estrogen  Urinary Symptoms: Does not leak urine.   Day time voids 5.  Nocturia: 0 times per night to void. Voiding dysfunction: she empties her bladder well.  does not use a catheter to empty bladder.  When urinating, she feels she has no difficulties  UTIs:  0  UTI's in the last year.   Denies history of blood in urine and kidney or bladder stones  Pelvic Organ Prolapse Symptoms:                  She Denies a feeling of a bulge the vaginal area.   Bowel Symptom: Bowel movements: 1 time(s) per day or every other Stool consistency: loose Straining: no.  Splinting: no.  Incomplete evacuation: no.  She Denies accidental bowel leakage / fecal incontinence Bowel regimen: none  Sexual Function Sexually active: yes.  Sexual orientation:  hetero Pain with sex: at the vaginal opening, deep in the pelvis. Pain is sharp in nature. Has been going on about 9 months.  - Tried: silicone lubricant (wet platinum), estrogen cream and vagifem. Have been using an estrogen patch (with oral progesterone). With the patch, she maybe notices a difference.  - Estring not covered by insurance - She has bought dilators but not using consistently. Has noticed some tearing at the vaginal opening.   Pelvic Pain Denies pelvic pain   Past Medical History:  Past Medical History:  Diagnosis Date   Allergy    Constipation     after gastric surgery having issues    Depression    Endometriosis    Sleep apnea    wears cpap   Thyroid goiter      Past Surgical History:   Past Surgical History:  Procedure Laterality Date   BIOPSY THYROID     BREAST BIOPSY Left    BREAST BIOPSY Right    CHOLECYSTECTOMY  1999   COLONOSCOPY     15 yrs ago   LAPAROSCOPIC GASTRIC BAND REMOVAL WITH LAPAROSCOPIC GASTRIC SLEEVE RESECTION  12/2014   LAPAROTOMY     LEFT OOPHORECTOMY       Past OB/GYN History: OB History  Gravida Para Term Preterm AB Living  0 0 0 0 0 0  SAB IAB Ectopic Multiple Live Births  0 0 0 0 0   Menopausal: Denies vaginal bleeding since menopause Last pap smear was 2023- negative.     Medications: She has a current medication list which includes the following prescription(s): cholecalciferol, clobetasol, clobetasol propionate e, vitamin b-12, cyclobenzaprine, krill oil, lactobacillus-inulin, levocetirizine, levothyroxine, lidocaine-prilocaine, meloxicam, multivitamin-iron-minerals-folic acid, progesterone, rosuvastatin, and valacyclovir.   Allergies: Patient is allergic to cefaclor.   Social History:  Social History   Tobacco Use   Smoking status: Never   Smokeless tobacco: Never  Vaping Use   Vaping status: Never Used  Substance Use Topics   Alcohol use: No   Drug use: No    Relationship status: married She lives with husband.   She  is not employed . Regular exercise: Yes: walking, strength training History of abuse: No  Family History:   Family History  Problem Relation Age of Onset   Colon polyps Mother    Heart disease Father        stent   Sleep apnea Father    Alzheimer's disease Father    Depression Brother        bipolar   Hyperlipidemia Other    Thyroid disease Other    Colon cancer Neg Hx    Esophageal cancer Neg Hx    Rectal cancer Neg Hx    Stomach cancer Neg Hx      Review of Systems: Review of Systems  Constitutional:  Positive for malaise/fatigue.  Negative for fever and weight loss.  Respiratory:  Negative for cough, shortness of breath and wheezing.   Cardiovascular:  Negative for chest pain, palpitations and leg swelling.  Gastrointestinal:  Negative for abdominal pain and blood in stool.  Genitourinary:  Negative for dysuria.  Musculoskeletal:  Negative for myalgias.  Skin:  Negative for rash.  Neurological:  Negative for dizziness and headaches.  Endo/Heme/Allergies:  Bruises/bleeds easily.       +hot flashes  Psychiatric/Behavioral:  Negative for depression. The patient is not nervous/anxious.      OBJECTIVE Physical Exam: Vitals:   12/01/22 1255  BP: 106/75  Pulse: 91  Weight: 248 lb (112.5 kg)  Height: 5' 6.93" (1.7 m)    Physical Exam Constitutional:      General: She is not in acute distress. Pulmonary:     Effort: Pulmonary effort is normal.  Abdominal:     General: There is no distension.     Palpations: Abdomen is soft.     Tenderness: There is no abdominal tenderness. There is no rebound.  Musculoskeletal:        General: No swelling. Normal range of motion.  Skin:    General: Skin is warm and dry.     Findings: No rash.  Neurological:     Mental Status: She is alert and oriented to person, place, and time.  Psychiatric:        Mood and Affect: Mood normal.        Behavior: Behavior normal.      GU / Detailed Urogynecologic Evaluation:  Pelvic Exam: Lichen sclerosus noted over labia majora, inner labial folds, perineum, clitoral hood and down above the rectum. Introitus appears erythematous; Bartholin's and Skene's glands normal in appearance; urethral meatus normal in appearance, no urethral masses or discharge.   Q-tip test negative for allodynia CST: negative  Speculum exam reveals normal vaginal mucosa with atrophy. Cervix normal appearance. Uterus normal single, nontender. Adnexa no mass, fullness, tenderness.     Pelvic floor strength II/V  Pelvic floor musculature: Right levator  non-tender, Right obturator  mildly tender , Left levator non-tender, Left obturator  mildly tender  POP-Q:   POP-Q  -3                                            Aa   -3                                           Ba  -8.5  C   2                                            Gh  4                                            Pb  9                                            tvl   -2.5                                            Ap  -2.5                                            Bp  -9                                              D      Rectal Exam:  Normal external rectum  Post-Void Residual (PVR) by Bladder Scan: In order to evaluate bladder emptying, we discussed obtaining a postvoid residual and she agreed to this procedure.  Procedure: The ultrasound unit was placed on the patient's abdomen in the suprapubic region after the patient had voided. A PVR of 51 ml was obtained by bladder scan.  Laboratory Results: POC urine: negative  ASSESSMENT AND PLAN Ms. Maria Terrell is a 57 y.o. with:  1. Lichen sclerosus   2. Dyspareunia, female   3. Urinary frequency    Lichen sclerosus - likely the cause of the pain at introitus - prescribed clobetasol ointment to use nightly for a month, then every other day for a month then down to twice a week for maintenance.  - continue estrogen cream twice a week in addition to the patch  2. Dyspareunia - discussed using graduated dilators to help stretch the introitus - continue lubricant as needed - Prescribed emla cream to use prior to intercourse - referral sent to pelvic PT  Return 2 months  Marguerita Beards, MD

## 2022-12-01 NOTE — Patient Instructions (Addendum)
Use clobetasol nightly for a month, then decrease to every other day for a month. Then decrease to 2-3 times a week.   Use vaginal estrogen cream 2-3 times a week.   Can place lidocaine cream at the opening prior to intercourse. Let sit for abt 20 min then wipe off any excess prior to intercourse.

## 2022-12-13 ENCOUNTER — Ambulatory Visit: Payer: 59 | Attending: Obstetrics and Gynecology

## 2022-12-13 ENCOUNTER — Other Ambulatory Visit: Payer: Self-pay

## 2022-12-13 DIAGNOSIS — M62838 Other muscle spasm: Secondary | ICD-10-CM | POA: Diagnosis not present

## 2022-12-13 DIAGNOSIS — M6281 Muscle weakness (generalized): Secondary | ICD-10-CM | POA: Insufficient documentation

## 2022-12-13 DIAGNOSIS — N941 Unspecified dyspareunia: Secondary | ICD-10-CM | POA: Insufficient documentation

## 2022-12-13 DIAGNOSIS — R293 Abnormal posture: Secondary | ICD-10-CM | POA: Diagnosis present

## 2022-12-13 DIAGNOSIS — R279 Unspecified lack of coordination: Secondary | ICD-10-CM | POA: Insufficient documentation

## 2022-12-13 NOTE — Therapy (Signed)
OUTPATIENT PHYSICAL THERAPY FEMALE PELVIC EVALUATION   Patient Name: Maria Terrell MRN: [id number] DOB:09-Sep-1965, 57 y.o., female Today's Date: 12/13/2022  END OF SESSION:  PT End of Session - 12/13/22 1232     Visit Number 1    Date for PT Re-Evaluation 05/30/23    Authorization Type [employer]    PT Start Time 1231    PT Stop Time 1310    PT Time Calculation (min) 39 min    Activity Tolerance Patient tolerated treatment well    Behavior During Therapy WFL for tasks assessed/performed             Past Medical History:  Diagnosis Date   Allergy    Constipation    after gastric surgery having issues    Depression    Endometriosis    Sleep apnea    wears cpap   Thyroid goiter    Past Surgical History:  Procedure Laterality Date   BIOPSY THYROID     BREAST BIOPSY Left    BREAST BIOPSY Right    CHOLECYSTECTOMY  1999   COLONOSCOPY     15 yrs ago   LAPAROSCOPIC GASTRIC BAND REMOVAL WITH LAPAROSCOPIC GASTRIC SLEEVE RESECTION  12/2014   LAPAROTOMY     LEFT OOPHORECTOMY     Patient Active Problem List   Diagnosis Date Noted   Dependence on CPAP ventilation 04/11/2021   Obesity (BMI 35.0-39.9 without comorbidity) 04/11/2021   History of mononucleosis 05/09/2017   Depression 05/09/2017   OSA on CPAP 05/09/2017   Chronic fatigue 05/09/2017   Hypothyroidism due to Hashimoto's thyroiditis 05/09/2017   Class 2 severe obesity with serious comorbidity and body mass index (BMI) of 37.0 to 37.9 in adult (HCC) 05/09/2017   Pain in joint, ankle and foot 07/10/2013   Excessive sleepiness 07/03/2012   Goiter 07/03/2012   Visit for preventive health examination 07/03/2012   Plantar fasciitis of right foot 04/16/2012   History of international travel 04/09/2012   OTHER SPECIFIED DISORDER OF SKIN 01/28/2010   CARPAL TUNNEL SYNDROME, RIGHT 10/22/2009   IRON DEFICIENCY 07/28/2009   DIARRHEA 12/31/2008   OTHER SPECIFIED DISORDER OF RECTUM AND ANUS 07/24/2008   PRURITUS, ANAL  07/24/2008   SHOULDER PAIN, LEFT 01/24/2008   OBESITY 04/19/2007   LOW BACK PAIN, CHRONIC 03/13/2007   OBSTRUCTIVE SLEEP APNEA 02/26/2007   FATIGUE 01/04/2007   Enlarged lymph nodes 12/20/2006   INTERTRIGO, CANDIDAL 11/16/2006   ABNORMAL RESULT, FUNCTION STUDY, THYROID 11/16/2006   DEPRESSION 09/06/2006   Allergic rhinitis 09/06/2006   Endometriosis 09/06/2006    PCP: Aliene Beams, MD  REFERRING PROVIDER: Marguerita Beards, MD   REFERRING DIAG: N94.10 (ICD-10-CM) - Dyspareunia, female  THERAPY DIAG:  Abnormal posture  Muscle weakness (generalized)  Other muscle spasm  Unspecified lack of coordination  Rationale for Evaluation and Treatment: Rehabilitation  ONSET DATE: 9 months  SUBJECTIVE:  SUBJECTIVE STATEMENT: Pt states that she is going through menopause and May 2023 was her last menstrual cycle. She feels like for the last 9 months she has been having more pain with intercourse. She does not get any enjoyment out of intimate activities. She was doing estrogen cream, but stopped doing that. She has started the estrogen patch; and told to stop at the same time. She was diagnosed with lichens sclerosis. She as always had some sort of achy deep pain with intercourse. She has a grade 2-3 spondylolisthesis At L5-S1.  Fluid intake: Yes: never enough- 50-60oz water daily    PAIN:  Are you having pain? Yes NPRS scale: 8/10 Pain location: Vaginal  Pain type: aching; burning, itching, tearing Pain description:  intermittent - with     Aggravating factors: intercourse/vaginal penetration Relieving factors: no vaginal penetration  PRECAUTIONS: None  RED FLAGS: None   WEIGHT BEARING RESTRICTIONS: No  FALLS:  Has patient fallen in last 6 months? 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   PLOF: Independent  PATIENT GOALS: to be able to have pain free intimacy   PERTINENT HISTORY:  Lichens sclerosis, endometriosis, gastric band, laparotomy with cyst removal and Lt oophorectomy, cholecystectomy,   BOWEL MOVEMENT: Pain with bowel movement: No Type of bowel movement:Frequency 1-2x/day and Strain No Fully empty rectum: No Leakage: No - typically non, but does have urgency Pads: No Fiber supplement: No  URINATION: Pain with urination: No Fully empty bladder: Yes: - Stream: Strong Urgency: No Frequency: 3-4x/day; 0x/night Leakage:  none Pads: No  INTERCOURSE: Pain with intercourse: Initial Penetration and During Penetration Ability to have vaginal penetration:  Yes: pushes through pain Climax: Unsure if she has ever had one Marinoff Scale: 2/3  PREGNANCY: NA  PROLAPSE: None   OBJECTIVE:  Note: Objective measures were completed at Evaluation unless otherwise noted. 12/13/22  COGNITION: Overall cognitive status: Within functional limits for tasks assessed     SENSATION: Light touch: Appears intact Proprioception: Appears intact  FUNCTIONAL TESTS:  Breath holding with bed mobility   GAIT: Comments: Mild bil compensated trendelenburg  POSTURE: rounded shoulders, forward head, increased lumbar lordosis, increased thoracic kyphosis, and anterior pelvic tilt  LUMBARAROM/PROM:  A/PROM A/PROM  Eval (% available)  Flexion 50  Extension 50  Right lateral flexion 75  Left lateral flexion 75  Right rotation 50  Left rotation 50   (Blank rows = not tested)    PALPATION:   General  Scar tissue palpation in lower Lt quadrant with tenderness; in general abdominal restriction                External Perineal Exam evidence of lichens sclerosis plaques on vulva and anus; fragile vulvar tissue with redness; evidence of vulvar atrophy and labial fusion                              Internal Pelvic Floor mild tenderness throughout superficial pelvic floor; aching and muscle spasm in bil levator ani  Patient confirms identification and approves PT to assess internal pelvic floor and treatment Yes  PELVIC MMT:   MMT eval  Vaginal 3/5, 4 second hold, 4 repeat contractions  Diastasis Recti No diastasis, no distortion with increased abdominal pressure  (Blank rows = not tested)        TONE: high  PROLAPSE: Not tested  TODAY'S TREATMENT:  DATE:  12/13/22  EVAL  Therapeutic activities: Theatre manager Lubricants - samples given Benefit of sex therapy - referral made    PATIENT EDUCATION:  Education details: See above Person educated: Patient Education method: Explanation, Demonstration, Tactile cues, Verbal cues, and Handouts Education comprehension: verbalized understanding  HOME EXERCISE PROGRAM: Written handout   ASSESSMENT:  CLINICAL IMPRESSION: Patient is a 57 y.o. female who was seen today for physical therapy evaluation and treatment for dyspareunia. Exam findings notable for abnormal posture/gait, functional core weakness demonstrated through bed mobility and chin-up test, decreased lumbar A/ROM, fragile vulvar tissue with labial fusion and redness, pelvic floor muscle weakness and decreased endurance, poor coordination with repeat pelvic floor muscle contractions, deep pelvic floor muscle restriction with tenderness upon palpation bil, tenderness in superficial pelvic floor muscle, abdominal tenderness (most notable in Lt lower quadrant with significant scar tissue restriction), and abdominal distortion with increased pressure. Signs and symptoms are most notable with superficial pelvic floor muscle and vulvar atrophy/tissue changes, levator ani spasm/restriction, and poor coordination of pelvic  floor muscle. Initial treatment consisted of anatomy education, benefit of regular orgasm and vibrator recommendations, lubricant samples, and benefit of sex therapy. She will continue to benefit from skilled PT intervention in order to decrease dyspareunia, improve pelvic floor length/tension relationship, and begin/progress functional strengthening program.   OBJECTIVE IMPAIRMENTS: decreased activity tolerance, decreased coordination, decreased endurance, decreased strength, increased fascial restrictions, increased muscle spasms, impaired tone, postural dysfunction, and pain.   ACTIVITY LIMITATIONS:  self-stimulation  PARTICIPATION LIMITATIONS: interpersonal relationship  PERSONAL FACTORS: 1 comorbidity: medical history  are also affecting patient's functional outcome.   REHAB POTENTIAL: Good  CLINICAL DECISION MAKING: Stable/uncomplicated  EVALUATION COMPLEXITY: Low   GOALS: Goals reviewed with patient? Yes  SHORT TERM GOALS: Target date: 01/10/23  Pt will be independent with HEP.   Baseline: Goal status: INITIAL  2.  Pt will demonstrate 25% improvement in all lumbar A/ROM without pain.  Baseline:  Goal status: INITIAL  3.  Pt will be independent with perineal massage and performing 4-5x/week. Baseline:  Goal status: INITIAL  4.  Pt will begin using pelvic floor muscle wand 2-3x/week in order to decrease deep pelvic floor tension and pain with penetration.  Baseline:  Goal status: INITIAL   LONG TERM GOALS: Target date: 05/30/22  Pt will be independent with advanced HEP.   Baseline:  Goal status: INITIAL  2.  Pt will be able to perform 10 repeat pelvic floor muscle contractions in 10 seconds in order to demonstrate improved coordination.  Baseline:  Goal status: INITIAL  3.  Pt will report no greater than 1/10 pain with vaginal penetration in order to improve intimate relationship with partner.    Baseline:  Goal status: INITIAL  4.  Pt will report successful  orgasm with external stimulation of clitoris in order to improve natural lubrication prior to intercourse.  Baseline:  Goal status: INITIAL    PLAN:  PT FREQUENCY: 1-2x/week  PT DURATION: 6 months  PLANNED INTERVENTIONS: 97110-Therapeutic exercises, 97530- Therapeutic activity, 97112- Neuromuscular re-education, 97535- Self Care, 16109- Manual therapy, Dry Needling, and Biofeedback  PLAN FOR NEXT SESSION: Internal pelvic floor muscle release, mobility activities, abdominal scar tissue mobilization.    Julio Alm, PT, DPT10/16/241:55 PM

## 2022-12-26 ENCOUNTER — Encounter: Payer: Self-pay | Admitting: Obstetrics and Gynecology

## 2022-12-26 DIAGNOSIS — B372 Candidiasis of skin and nail: Secondary | ICD-10-CM

## 2022-12-28 ENCOUNTER — Ambulatory Visit: Payer: 59

## 2022-12-28 DIAGNOSIS — M62838 Other muscle spasm: Secondary | ICD-10-CM

## 2022-12-28 DIAGNOSIS — R293 Abnormal posture: Secondary | ICD-10-CM | POA: Diagnosis not present

## 2022-12-28 DIAGNOSIS — M6281 Muscle weakness (generalized): Secondary | ICD-10-CM

## 2022-12-28 DIAGNOSIS — R279 Unspecified lack of coordination: Secondary | ICD-10-CM

## 2022-12-28 MED ORDER — FLUCONAZOLE 150 MG PO TABS
150.0000 mg | ORAL_TABLET | Freq: Every day | ORAL | 0 refills | Status: AC
Start: 2022-12-28 — End: 2022-12-30

## 2022-12-28 NOTE — Therapy (Signed)
OUTPATIENT PHYSICAL THERAPY FEMALE PELVIC TREATMENT   Patient Name: Maria Terrell MRN: [id number] DOB:December 22, 1965, 57 y.o., female Today's Date: 12/28/2022  END OF SESSION:  PT End of Session - 12/28/22 1150     Visit Number 2    Date for PT Re-Evaluation 05/30/23    Authorization Type [employer]    PT Start Time 1149    PT Stop Time 1227    PT Time Calculation (min) 38 min    Activity Tolerance Patient tolerated treatment well    Behavior During Therapy WFL for tasks assessed/performed             Past Medical History:  Diagnosis Date   Allergy    Constipation    after gastric surgery having issues    Depression    Endometriosis    Sleep apnea    wears cpap   Thyroid goiter    Past Surgical History:  Procedure Laterality Date   BIOPSY THYROID     BREAST BIOPSY Left    BREAST BIOPSY Right    CHOLECYSTECTOMY  1999   COLONOSCOPY     15 yrs ago   LAPAROSCOPIC GASTRIC BAND REMOVAL WITH LAPAROSCOPIC GASTRIC SLEEVE RESECTION  12/2014   LAPAROTOMY     LEFT OOPHORECTOMY     Patient Active Problem List   Diagnosis Date Noted   Dependence on CPAP ventilation 04/11/2021   Obesity (BMI 35.0-39.9 without comorbidity) 04/11/2021   History of mononucleosis 05/09/2017   Depression 05/09/2017   OSA on CPAP 05/09/2017   Chronic fatigue 05/09/2017   Hypothyroidism due to Hashimoto's thyroiditis 05/09/2017   Class 2 severe obesity with serious comorbidity and body mass index (BMI) of 37.0 to 37.9 in adult (HCC) 05/09/2017   Pain in joint, ankle and foot 07/10/2013   Excessive sleepiness 07/03/2012   Goiter 07/03/2012   Visit for preventive health examination 07/03/2012   Plantar fasciitis of right foot 04/16/2012   History of international travel 04/09/2012   OTHER SPECIFIED DISORDER OF SKIN 01/28/2010   CARPAL TUNNEL SYNDROME, RIGHT 10/22/2009   IRON DEFICIENCY 07/28/2009   DIARRHEA 12/31/2008   OTHER SPECIFIED DISORDER OF RECTUM AND ANUS 07/24/2008   PRURITUS, ANAL  07/24/2008   SHOULDER PAIN, LEFT 01/24/2008   OBESITY 04/19/2007   LOW BACK PAIN, CHRONIC 03/13/2007   OBSTRUCTIVE SLEEP APNEA 02/26/2007   FATIGUE 01/04/2007   Enlarged lymph nodes 12/20/2006   INTERTRIGO, CANDIDAL 11/16/2006   ABNORMAL RESULT, FUNCTION STUDY, THYROID 11/16/2006   DEPRESSION 09/06/2006   Allergic rhinitis 09/06/2006   Endometriosis 09/06/2006    PCP: Aliene Beams, MD  REFERRING PROVIDER: Marguerita Beards, MD   REFERRING DIAG: N94.10 (ICD-10-CM) - Dyspareunia, female  THERAPY DIAG:  Abnormal posture  Muscle weakness (generalized)  Other muscle spasm  Unspecified lack of coordination  Rationale for Evaluation and Treatment: Rehabilitation  ONSET DATE: 9 months  SUBJECTIVE:  SUBJECTIVE STATEMENT: Pt states that she has been using the clobetasol cream for the last 2 weeks in addition to the estrogen cream. She is having a reaction to the creams with a rash having developed in the last 3-5 days. She is fairly uncomfortable in her perineum currently.   PAIN:  Are you having pain? Yes NPRS scale: 8/10 Pain location: Vaginal  Pain type: aching; burning, itching, tearing Pain description:  intermittent      Aggravating factors: intercourse/vaginal penetration Relieving factors: no vaginal penetration  PRECAUTIONS: None  RED FLAGS: None   WEIGHT BEARING RESTRICTIONS: No  FALLS:  Has patient fallen in last 6 months? 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   PLOF: Independent  PATIENT GOALS: to be able to have pain free intimacy   PERTINENT HISTORY:  Lichens sclerosis, endometriosis, gastric band, laparotomy with cyst removal and Lt oophorectomy, cholecystectomy,   BOWEL  MOVEMENT: Pain with bowel movement: No Type of bowel movement:Frequency 1-2x/day and Strain No Fully empty rectum: No Leakage: No - typically non, but does have urgency Pads: No Fiber supplement: No  URINATION: Pain with urination: No Fully empty bladder: Yes: - Stream: Strong Urgency: No Frequency: 3-4x/day; 0x/night Leakage:  none Pads: No  INTERCOURSE: Pain with intercourse: Initial Penetration and During Penetration Ability to have vaginal penetration:  Yes: pushes through pain Climax: Unsure if she has ever had one Marinoff Scale: 2/3  PREGNANCY: NA  PROLAPSE: None   OBJECTIVE:  Note: Objective measures were completed at Evaluation unless otherwise noted. 12/13/22  COGNITION: Overall cognitive status: Within functional limits for tasks assessed     SENSATION: Light touch: Appears intact Proprioception: Appears intact  FUNCTIONAL TESTS:  Breath holding with bed mobility   GAIT: Comments: Mild bil compensated trendelenburg  POSTURE: rounded shoulders, forward head, increased lumbar lordosis, increased thoracic kyphosis, and anterior pelvic tilt  LUMBARAROM/PROM:  A/PROM A/PROM  Eval (% available)  Flexion 50  Extension 50  Right lateral flexion 75  Left lateral flexion 75  Right rotation 50  Left rotation 50   (Blank rows = not tested)    PALPATION:   General  Scar tissue palpation in lower Lt quadrant with tenderness; in general abdominal restriction                External Perineal Exam evidence of lichens sclerosis plaques on vulva and anus; fragile vulvar tissue with redness; evidence of vulvar atrophy and labial fusion                             Internal Pelvic Floor mild tenderness throughout superficial pelvic floor; aching and muscle spasm in bil levator ani  Patient confirms identification and approves PT to assess internal pelvic floor and treatment Yes  PELVIC MMT:   MMT eval  Vaginal 3/5, 4 second hold, 4 repeat contractions   Diastasis Recti No diastasis, no distortion with increased abdominal pressure  (Blank rows = not tested)        TONE: high  PROLAPSE: Not tested  TODAY'S TREATMENT:  DATE:  12/28/22 Manual: Abdominal mobilization Neuromuscular re-education: Butterfly 10 breaths Cat cow 2 x 10 Child's pose 10 breaths Diaphragmatic breathing 10 breaths  Exercises: Lower trunk rotation 2 x 10   12/13/22  EVAL  Therapeutic activities: Theatre manager Lubricants - samples given Benefit of sex therapy - referral made    PATIENT EDUCATION:  Education details: See above Person educated: Patient Education method: Explanation, Demonstration, Tactile cues, Verbal cues, and Handouts Education comprehension: verbalized understanding  HOME EXERCISE PROGRAM:   ASSESSMENT:  CLINICAL IMPRESSION: Due to perineal rash that has patient very uncomfortable today, no internal manual techniques to pelvic floor muscle today. Instead, we performed down training activities and abdominal soft tissue mobilization to improve mobility and circulation. She did well with all activities today with no disocmfort reported. HEP updated. We discussed and performed diaphragmatic breathing with cues to help improve pelvic floor muscle relaxation. She demonstrates diaphragm restriction, but believe better awareness will help with this. She will continue to benefit from skilled PT intervention in order to decrease dyspareunia, improve pelvic floor length/tension relationship, and begin/progress functional strengthening program.   OBJECTIVE IMPAIRMENTS: decreased activity tolerance, decreased coordination, decreased endurance, decreased strength, increased fascial restrictions, increased muscle spasms, impaired tone, postural dysfunction, and pain.   ACTIVITY LIMITATIONS:   self-stimulation  PARTICIPATION LIMITATIONS: interpersonal relationship  PERSONAL FACTORS: 1 comorbidity: medical history  are also affecting patient's functional outcome.   REHAB POTENTIAL: Good  CLINICAL DECISION MAKING: Stable/uncomplicated  EVALUATION COMPLEXITY: Low   GOALS: Goals reviewed with patient? Yes  SHORT TERM GOALS: Target date: 01/10/23  Pt will be independent with HEP.   Baseline: Goal status: INITIAL  2.  Pt will demonstrate 25% improvement in all lumbar A/ROM without pain.  Baseline:  Goal status: INITIAL  3.  Pt will be independent with perineal massage and performing 4-5x/week. Baseline:  Goal status: INITIAL  4.  Pt will begin using pelvic floor muscle wand 2-3x/week in order to decrease deep pelvic floor tension and pain with penetration.  Baseline:  Goal status: INITIAL   LONG TERM GOALS: Target date: 05/30/22  Pt will be independent with advanced HEP.   Baseline:  Goal status: INITIAL  2.  Pt will be able to perform 10 repeat pelvic floor muscle contractions in 10 seconds in order to demonstrate improved coordination.  Baseline:  Goal status: INITIAL  3.  Pt will report no greater than 1/10 pain with vaginal penetration in order to improve intimate relationship with partner.    Baseline:  Goal status: INITIAL  4.  Pt will report successful orgasm with external stimulation of clitoris in order to improve natural lubrication prior to intercourse.  Baseline:  Goal status: INITIAL    PLAN:  PT FREQUENCY: 1-2x/week  PT DURATION: 6 months  PLANNED INTERVENTIONS: 97110-Therapeutic exercises, 97530- Therapeutic activity, 97112- Neuromuscular re-education, 97535- Self Care, [id number]- Manual therapy, Dry Needling, and Biofeedback  PLAN FOR NEXT SESSION: Internal pelvic floor muscle release, mobility activities, abdominal scar tissue mobilization.    Julio Alm, PT, DPT10/31/2412:34 PM

## 2023-01-10 ENCOUNTER — Ambulatory Visit: Payer: 59 | Attending: Obstetrics and Gynecology

## 2023-01-10 DIAGNOSIS — R279 Unspecified lack of coordination: Secondary | ICD-10-CM | POA: Insufficient documentation

## 2023-01-10 DIAGNOSIS — R293 Abnormal posture: Secondary | ICD-10-CM | POA: Diagnosis present

## 2023-01-10 DIAGNOSIS — M6281 Muscle weakness (generalized): Secondary | ICD-10-CM | POA: Insufficient documentation

## 2023-01-10 DIAGNOSIS — M62838 Other muscle spasm: Secondary | ICD-10-CM | POA: Diagnosis present

## 2023-01-10 NOTE — Therapy (Signed)
OUTPATIENT PHYSICAL THERAPY FEMALE PELVIC TREATMENT   Patient Name: Maria Terrell MRN: [id number] DOB:1965/11/24, 57 y.o., female Today's Date: 01/10/2023  END OF SESSION:  PT End of Session - 01/10/23 1104     Visit Number 3    Date for PT Re-Evaluation 05/30/23    Authorization Type [employer]    PT Start Time 1100    PT Stop Time 1140    PT Time Calculation (min) 40 min    Activity Tolerance Patient tolerated treatment well    Behavior During Therapy WFL for tasks assessed/performed              Past Medical History:  Diagnosis Date   Allergy    Constipation    after gastric surgery having issues    Depression    Endometriosis    Sleep apnea    wears cpap   Thyroid goiter    Past Surgical History:  Procedure Laterality Date   BIOPSY THYROID     BREAST BIOPSY Left    BREAST BIOPSY Right    CHOLECYSTECTOMY  1999   COLONOSCOPY     15 yrs ago   LAPAROSCOPIC GASTRIC BAND REMOVAL WITH LAPAROSCOPIC GASTRIC SLEEVE RESECTION  12/2014   LAPAROTOMY     LEFT OOPHORECTOMY     Patient Active Problem List   Diagnosis Date Noted   Dependence on CPAP ventilation 04/11/2021   Obesity (BMI 35.0-39.9 without comorbidity) 04/11/2021   History of mononucleosis 05/09/2017   Depression 05/09/2017   OSA on CPAP 05/09/2017   Chronic fatigue 05/09/2017   Hypothyroidism due to Hashimoto's thyroiditis 05/09/2017   Class 2 severe obesity with serious comorbidity and body mass index (BMI) of 37.0 to 37.9 in adult (HCC) 05/09/2017   Pain in joint, ankle and foot 07/10/2013   Excessive sleepiness 07/03/2012   Goiter 07/03/2012   Visit for preventive health examination 07/03/2012   Plantar fasciitis of right foot 04/16/2012   History of international travel 04/09/2012   OTHER SPECIFIED DISORDER OF SKIN 01/28/2010   CARPAL TUNNEL SYNDROME, RIGHT 10/22/2009   IRON DEFICIENCY 07/28/2009   DIARRHEA 12/31/2008   OTHER SPECIFIED DISORDER OF RECTUM AND ANUS 07/24/2008   PRURITUS, ANAL  07/24/2008   SHOULDER PAIN, LEFT 01/24/2008   OBESITY 04/19/2007   LOW BACK PAIN, CHRONIC 03/13/2007   OBSTRUCTIVE SLEEP APNEA 02/26/2007   FATIGUE 01/04/2007   Enlarged lymph nodes 12/20/2006   INTERTRIGO, CANDIDAL 11/16/2006   ABNORMAL RESULT, FUNCTION STUDY, THYROID 11/16/2006   DEPRESSION 09/06/2006   Allergic rhinitis 09/06/2006   Endometriosis 09/06/2006    PCP: Aliene Beams, MD  REFERRING PROVIDER: Marguerita Beards, MD   REFERRING DIAG: N94.10 (ICD-10-CM) - Dyspareunia, female  THERAPY DIAG:  Abnormal posture  Muscle weakness (generalized)  Other muscle spasm  Unspecified lack of coordination  Rationale for Evaluation and Treatment: Rehabilitation  ONSET DATE: 9 months  SUBJECTIVE:  SUBJECTIVE STATEMENT: Pt states that the clobetasol cream that she was using continued to cause a lot of irritation. She has stopped using and states that all of the irritation and uncomfortable symptoms have improved.   PAIN:  Are you having pain? Yes NPRS scale: 8/10 Pain location: Vaginal  Pain type: aching; burning, itching, tearing Pain description:  intermittent      Aggravating factors: intercourse/vaginal penetration Relieving factors: no vaginal penetration  PRECAUTIONS: None  RED FLAGS: None   WEIGHT BEARING RESTRICTIONS: No  FALLS:  Has patient fallen in last 6 months? 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   PLOF: Independent  PATIENT GOALS: to be able to have pain free intimacy   PERTINENT HISTORY:  Lichens sclerosis, endometriosis, gastric band, laparotomy with cyst removal and Lt oophorectomy, cholecystectomy,   BOWEL MOVEMENT: Pain with bowel movement: No Type of bowel  movement:Frequency 1-2x/day and Strain No Fully empty rectum: No Leakage: No - typically non, but does have urgency Pads: No Fiber supplement: No  URINATION: Pain with urination: No Fully empty bladder: Yes: - Stream: Strong Urgency: No Frequency: 3-4x/day; 0x/night Leakage:  none Pads: No  INTERCOURSE: Pain with intercourse: Initial Penetration and During Penetration Ability to have vaginal penetration:  Yes: pushes through pain Climax: Unsure if she has ever had one Marinoff Scale: 2/3  PREGNANCY: NA  PROLAPSE: None   OBJECTIVE:  Note: Objective measures were completed at Evaluation unless otherwise noted. 12/13/22  COGNITION: Overall cognitive status: Within functional limits for tasks assessed     SENSATION: Light touch: Appears intact Proprioception: Appears intact  FUNCTIONAL TESTS:  Breath holding with bed mobility   GAIT: Comments: Mild bil compensated trendelenburg  POSTURE: rounded shoulders, forward head, increased lumbar lordosis, increased thoracic kyphosis, and anterior pelvic tilt  LUMBARAROM/PROM:  A/PROM A/PROM  Eval (% available)  Flexion 50  Extension 50  Right lateral flexion 75  Left lateral flexion 75  Right rotation 50  Left rotation 50   (Blank rows = not tested)    PALPATION:   General  Scar tissue palpation in lower Lt quadrant with tenderness; in general abdominal restriction                External Perineal Exam evidence of lichens sclerosis plaques on vulva and anus; fragile vulvar tissue with redness; evidence of vulvar atrophy and labial fusion                             Internal Pelvic Floor mild tenderness throughout superficial pelvic floor; aching and muscle spasm in bil levator ani  Patient confirms identification and approves PT to assess internal pelvic floor and treatment Yes  PELVIC MMT:   MMT eval  Vaginal 3/5, 4 second hold, 4 repeat contractions  Diastasis Recti No diastasis, no distortion with  increased abdominal pressure  (Blank rows = not tested)        TONE: high  PROLAPSE: Not tested  TODAY'S TREATMENT:  DATE:  01/10/23 Manual: Pt provides verbal consent for internal vaginal/rectal pelvic floor exam. Internal superficial and deep vaginal pelvic floor muscle release in supine  Myofascial release at posterior fourchette  Neuromuscular re-education: Diaphragmatic breathing with pelvic floor muscle bulge/relaxation with internal vaginal feedback and multimodal cues Therapeutic activities: Vulvovaginal massage Lubricant review Working with partner on vaginal/vulvar desensitization   12/28/22 Manual: Abdominal mobilization Neuromuscular re-education: Butterfly 10 breaths Cat cow 2 x 10 Child's pose 10 breaths Diaphragmatic breathing 10 breaths  Exercises: Lower trunk rotation 2 x 10   12/13/22  EVAL  Therapeutic activities: Theatre manager Lubricants - samples given Benefit of sex therapy - referral made    PATIENT EDUCATION:  Education details: See above Person educated: Patient Education method: Explanation, Demonstration, Tactile cues, Verbal cues, and Handouts Education comprehension: verbalized understanding  HOME EXERCISE PROGRAM:   ASSESSMENT:  CLINICAL IMPRESSION: Pt was encouraged to communicate with MD about stopping clobetasol cream and having improved symptoms since stopping in case MD can recommend different mode of application. Since her vulvar irritation is better today, we decided to return to internal vaginal pelvic floor muscle exam to work on pelvic floor muscle relaxation techniques and manual techniques to help improve restriction. She tolerated very well with good awareness of pelvic floor relaxation upon inhale. She had very good release of muscle tone throughout bil superficial and deep  layers. Good myofascial release at posterior fourchette with decreased sensitivity. She will continue to benefit from skilled PT intervention in order to decrease dyspareunia, improve pelvic floor length/tension relationship, and begin/progress functional strengthening program.   OBJECTIVE IMPAIRMENTS: decreased activity tolerance, decreased coordination, decreased endurance, decreased strength, increased fascial restrictions, increased muscle spasms, impaired tone, postural dysfunction, and pain.   ACTIVITY LIMITATIONS:  self-stimulation  PARTICIPATION LIMITATIONS: interpersonal relationship  PERSONAL FACTORS: 1 comorbidity: medical history  are also affecting patient's functional outcome.   REHAB POTENTIAL: Good  CLINICAL DECISION MAKING: Stable/uncomplicated  EVALUATION COMPLEXITY: Low   GOALS: Goals reviewed with patient? Yes  SHORT TERM GOALS: Target date: 01/10/23 - updated 01/10/23  Pt will be independent with HEP.   Baseline: Goal status: MET 01/10/23  2.  Pt will demonstrate 25% improvement in all lumbar A/ROM without pain.  Baseline:  Goal status: IN PROGRESS  3.  Pt will be independent with perineal massage and performing 4-5x/week. Baseline:  Goal status: IN PROGRESS  4.  Pt will begin using pelvic floor muscle wand 2-3x/week in order to decrease deep pelvic floor tension and pain with penetration.  Baseline:  Goal status: IN PROGRESS   LONG TERM GOALS: Target date: 05/30/22 - updated 01/10/23  Pt will be independent with advanced HEP.   Baseline:  Goal status: IN PROGRESS  2.  Pt will be able to perform 10 repeat pelvic floor muscle contractions in 10 seconds in order to demonstrate improved coordination.  Baseline:  Goal status: IN PROGRESS  3.  Pt will report no greater than 1/10 pain with vaginal penetration in order to improve intimate relationship with partner.    Baseline:  Goal status: IN PROGRESS  4.  Pt will report successful orgasm with  external stimulation of clitoris in order to improve natural lubrication prior to intercourse.  Baseline:  Goal status: IN PROGRESS    PLAN:  PT FREQUENCY: 1-2x/week  PT DURATION: 6 months  PLANNED INTERVENTIONS: 97110-Therapeutic exercises, 97530- Therapeutic activity, 97112- Neuromuscular re-education, 97535- Self Care, [id number]- Manual therapy, Dry Needling, and Biofeedback  PLAN FOR NEXT SESSION: Mobility activities and  hip stretches.    Julio Alm, PT, DPT11/13/2411:40 AM

## 2023-01-30 ENCOUNTER — Ambulatory Visit: Payer: Medicare Other

## 2023-01-30 ENCOUNTER — Encounter: Payer: Self-pay | Admitting: Obstetrics and Gynecology

## 2023-01-31 ENCOUNTER — Other Ambulatory Visit (HOSPITAL_COMMUNITY)
Admission: RE | Admit: 2023-01-31 | Discharge: 2023-01-31 | Disposition: A | Discharge status: Home / Self Care | Payer: 59 | Source: Ambulatory Visit | Attending: Obstetrics and Gynecology | Admitting: Obstetrics and Gynecology

## 2023-01-31 ENCOUNTER — Encounter: Payer: Self-pay | Admitting: Obstetrics and Gynecology

## 2023-01-31 ENCOUNTER — Ambulatory Visit (INDEPENDENT_AMBULATORY_CARE_PROVIDER_SITE_OTHER): Payer: 59 | Admitting: Obstetrics and Gynecology

## 2023-01-31 VITALS — BP 122/82 | HR 91

## 2023-01-31 DIAGNOSIS — L900 Lichen sclerosus et atrophicus: Secondary | ICD-10-CM

## 2023-01-31 DIAGNOSIS — N9089 Other specified noninflammatory disorders of vulva and perineum: Secondary | ICD-10-CM | POA: Diagnosis present

## 2023-01-31 MED ORDER — LIDOCAINE-EPINEPHRINE 1 %-1:100000 IJ SOLN
4.0000 mL | Freq: Once | INTRAMUSCULAR | Status: AC
Start: 2023-01-31 — End: ?

## 2023-01-31 NOTE — Progress Notes (Signed)
[facility] Return Visit  SUBJECTIVE  History of Present Illness: Maria Terrell is a 57 y.o. female seen in follow-up for lichen sclerosus and dyspareunia. Plan at last visit was to use clobetasol, estrogen cream and emla cream. Was also referred to pelvic PT.   Had to stop clobetasol due to redness and irritation. She is using the estrogen cream twice week. Has noticed some improvement. Has started pelvic PT but has only done a few sessions.   Past Medical History: Patient  has a past medical history of Allergy, Constipation, Depression, Endometriosis, Sleep apnea, and Thyroid goiter.   Past Surgical History: She  has a past surgical history that includes laparotomy; Laparoscopic gastric band removal with laparoscopic gastric sleeve resection (12/2014); Cholecystectomy (1999); Colonoscopy; Biopsy thyroid; Breast biopsy (Left); Breast biopsy (Right); and Left oophorectomy.   Medications: She has a current medication list which includes the following prescription(s): cholecalciferol, clobetasol, clobetasol propionate e, vitamin b-12, cyclobenzaprine, krill oil, lactobacillus-inulin, levocetirizine, levothyroxine, lidocaine-prilocaine, meloxicam, multivitamin-iron-minerals-folic acid, progesterone, rosuvastatin, and valacyclovir.   Allergies: Patient is allergic to cefaclor.   Social History: Patient  reports that she has never smoked. She has never used smokeless tobacco. She reports that she does not drink alcohol and does not use drugs.      OBJECTIVE     Physical Exam: Vitals:   01/31/23 1326  BP: 122/82  Pulse: 91   Gen: No apparent distress, A&O x 3.  Detailed Urogynecologic Evaluation:  Improvement in tissue quality noted, specifically at introitus and over clitoral hood.  Mild white plaques over labia majora bilaterally, in inner folds of labia minora and on the lower vulva and near the anus.   Punch biopsy:  Area of white plaque with fissure identified  in crease of right inferior labia majora. Area prepped with betadine and injected with 4cc 1% lidocaine with epinephrine. 4mm punch biopsy obtained. 3-0 vicryl suture placed for hemostasis.    ASSESSMENT AND PLAN    Maria Terrell is a 57 y.o. with:  1. Vulvar lesion    - Punch biopsy sent today to confirm lichen sclerosus. Currently has minimal symptoms of itching so will not restart clobetasol at this time.  - improvement in tissues with vaginal estrogen use, continue twice a week.  - Continue with pelvic PT  Return 3 months  Marguerita Beards, MD

## 2023-02-01 ENCOUNTER — Ambulatory Visit: Payer: Medicare Other | Attending: Obstetrics and Gynecology

## 2023-02-01 DIAGNOSIS — M6281 Muscle weakness (generalized): Secondary | ICD-10-CM | POA: Diagnosis present

## 2023-02-01 DIAGNOSIS — R293 Abnormal posture: Secondary | ICD-10-CM | POA: Diagnosis present

## 2023-02-01 DIAGNOSIS — R279 Unspecified lack of coordination: Secondary | ICD-10-CM | POA: Insufficient documentation

## 2023-02-01 DIAGNOSIS — M62838 Other muscle spasm: Secondary | ICD-10-CM | POA: Diagnosis present

## 2023-02-01 NOTE — Therapy (Signed)
OUTPATIENT PHYSICAL THERAPY FEMALE PELVIC TREATMENT   Patient Name: Maria Terrell MRN: [id number] DOB:Apr 13, 1965, 57 y.o., female Today's Date: 02/01/2023  END OF SESSION:  PT End of Session - 02/01/23 1145     Visit Number 4    Date for PT Re-Evaluation 05/30/23    Authorization Type [employer]    PT Start Time 1145    PT Stop Time 1225    PT Time Calculation (min) 40 min    Activity Tolerance Patient tolerated treatment well    Behavior During Therapy WFL for tasks assessed/performed              Past Medical History:  Diagnosis Date   Allergy    Constipation    after gastric surgery having issues    Depression    Endometriosis    Sleep apnea    wears cpap   Thyroid goiter    Past Surgical History:  Procedure Laterality Date   BIOPSY THYROID     BREAST BIOPSY Left    BREAST BIOPSY Right    CHOLECYSTECTOMY  1999   COLONOSCOPY     15 yrs ago   LAPAROSCOPIC GASTRIC BAND REMOVAL WITH LAPAROSCOPIC GASTRIC SLEEVE RESECTION  12/2014   LAPAROTOMY     LEFT OOPHORECTOMY     Patient Active Problem List   Diagnosis Date Noted   Dependence on CPAP ventilation 04/11/2021   Obesity (BMI 35.0-39.9 without comorbidity) 04/11/2021   History of mononucleosis 05/09/2017   Depression 05/09/2017   OSA on CPAP 05/09/2017   Chronic fatigue 05/09/2017   Hypothyroidism due to Hashimoto's thyroiditis 05/09/2017   Class 2 severe obesity with serious comorbidity and body mass index (BMI) of 37.0 to 37.9 in adult (HCC) 05/09/2017   Pain in joint, ankle and foot 07/10/2013   Excessive sleepiness 07/03/2012   Goiter 07/03/2012   Visit for preventive health examination 07/03/2012   Plantar fasciitis of right foot 04/16/2012   History of international travel 04/09/2012   OTHER SPECIFIED DISORDER OF SKIN 01/28/2010   CARPAL TUNNEL SYNDROME, RIGHT 10/22/2009   IRON DEFICIENCY 07/28/2009   DIARRHEA 12/31/2008   OTHER SPECIFIED DISORDER OF RECTUM AND ANUS 07/24/2008   PRURITUS, ANAL  07/24/2008   SHOULDER PAIN, LEFT 01/24/2008   OBESITY 04/19/2007   LOW BACK PAIN, CHRONIC 03/13/2007   OBSTRUCTIVE SLEEP APNEA 02/26/2007   FATIGUE 01/04/2007   Enlarged lymph nodes 12/20/2006   INTERTRIGO, CANDIDAL 11/16/2006   ABNORMAL RESULT, FUNCTION STUDY, THYROID 11/16/2006   DEPRESSION 09/06/2006   Allergic rhinitis 09/06/2006   Endometriosis 09/06/2006    PCP: Aliene Beams, MD  REFERRING PROVIDER: Marguerita Beards, MD   REFERRING DIAG: N94.10 (ICD-10-CM) - Dyspareunia, female  THERAPY DIAG:  Abnormal posture  Muscle weakness (generalized)  Other muscle spasm  Unspecified lack of coordination  Rationale for Evaluation and Treatment: Rehabilitation  ONSET DATE: 9 months  SUBJECTIVE:  SUBJECTIVE STATEMENT: Pt states that she had biopsy from urogyn yesterday, but she said that she is able to work in PT today. She has brought husband to learn how to perform some of some of the manual techniques.   PAIN:  Are you having pain? Yes NPRS scale: 0/10 Pain location: Vaginal  Pain type: aching; burning, itching, tearing Pain description:  intermittent      Aggravating factors: intercourse/vaginal penetration Relieving factors: no vaginal penetration  PRECAUTIONS: None  RED FLAGS: None   WEIGHT BEARING RESTRICTIONS: No  FALLS:  Has patient fallen in last 6 months? 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   PLOF: Independent  PATIENT GOALS: to be able to have pain free intimacy   PERTINENT HISTORY:  Lichens sclerosis, endometriosis, gastric band, laparotomy with cyst removal and Lt oophorectomy, cholecystectomy,   BOWEL MOVEMENT: Pain with bowel movement: No Type of bowel movement:Frequency  1-2x/day and Strain No Fully empty rectum: No Leakage: No - typically non, but does have urgency Pads: No Fiber supplement: No  URINATION: Pain with urination: No Fully empty bladder: Yes: - Stream: Strong Urgency: No Frequency: 3-4x/day; 0x/night Leakage:  none Pads: No  INTERCOURSE: Pain with intercourse: Initial Penetration and During Penetration Ability to have vaginal penetration:  Yes: pushes through pain Climax: Unsure if she has ever had one Marinoff Scale: 2/3  PREGNANCY: NA  PROLAPSE: None   OBJECTIVE:  Note: Objective measures were completed at Evaluation unless otherwise noted. 12/13/22  COGNITION: Overall cognitive status: Within functional limits for tasks assessed     SENSATION: Light touch: Appears intact Proprioception: Appears intact  FUNCTIONAL TESTS:  Breath holding with bed mobility   GAIT: Comments: Mild bil compensated trendelenburg  POSTURE: rounded shoulders, forward head, increased lumbar lordosis, increased thoracic kyphosis, and anterior pelvic tilt  LUMBARAROM/PROM:  A/PROM A/PROM  Eval (% available)  Flexion 50  Extension 50  Right lateral flexion 75  Left lateral flexion 75  Right rotation 50  Left rotation 50   (Blank rows = not tested)    PALPATION:   General  Scar tissue palpation in lower Lt quadrant with tenderness; in general abdominal restriction                External Perineal Exam evidence of lichens sclerosis plaques on vulva and anus; fragile vulvar tissue with redness; evidence of vulvar atrophy and labial fusion                             Internal Pelvic Floor mild tenderness throughout superficial pelvic floor; aching and muscle spasm in bil levator ani  Patient confirms identification and approves PT to assess internal pelvic floor and treatment Yes  PELVIC MMT:   MMT eval  Vaginal 3/5, 4 second hold, 4 repeat contractions  Diastasis Recti No diastasis, no distortion with increased abdominal  pressure  (Blank rows = not tested)        TONE: high  PROLAPSE: Not tested  TODAY'S TREATMENT:  DATE:  02/01/23 Manual: Pt provides verbal consent for internal vaginal/rectal pelvic floor exam. Internal superficial and deep vaginal pelvic floor muscle release in supine  Myofascial release at posterior fourchette  Neuromuscular re-education: Diaphragmatic breathing with pelvic floor muscle bulge/relaxation with internal vaginal feedback and multimodal cue Therapeutic activities: Husband education on desensitization/mobilization techniques, foreplay, importance of orgasm    01/10/23 Manual: Pt provides verbal consent for internal vaginal/rectal pelvic floor exam. Internal superficial and deep vaginal pelvic floor muscle release in supine  Myofascial release at posterior fourchette  Neuromuscular re-education: Diaphragmatic breathing with pelvic floor muscle bulge/relaxation with internal vaginal feedback and multimodal cues Therapeutic activities: Vulvovaginal massage Lubricant review Working with partner on vaginal/vulvar desensitization   12/28/22 Manual: Abdominal mobilization Neuromuscular re-education: Butterfly 10 breaths Cat cow 2 x 10 Child's pose 10 breaths Diaphragmatic breathing 10 breaths  Exercises: Lower trunk rotation 2 x 10     PATIENT EDUCATION:  Education details: See above Person educated: Patient Education method: Programmer, multimedia, Demonstration, Tactile cues, Verbal cues, and Handouts Education comprehension: verbalized understanding  HOME EXERCISE PROGRAM:   ASSESSMENT:  CLINICAL IMPRESSION: Pt had difficult week with being sick and was not able to work on exercises quite as much. She had biopsy yesterday, but due to location, she felt like she could still tolerate internal pelvic floor muscle desensitization  techniques and brought husband for him to learn how to perform these. She tolerated all desensitization and mobilization work very well, but had increased tension in deep Lt/Rt superficial layers. Her husband reported that he felt comfortable reproducing some of these techniques as he was guided through various techniques and depth/direction of tender spots. We discussed importance of working together to help decrease sensitivity to partner specifically. We also discussed being intimate in other ways to help improve this sensitivity. She will continue to benefit from skilled PT intervention in order to decrease dyspareunia, improve pelvic floor length/tension relationship, and begin/progress functional strengthening program.   OBJECTIVE IMPAIRMENTS: decreased activity tolerance, decreased coordination, decreased endurance, decreased strength, increased fascial restrictions, increased muscle spasms, impaired tone, postural dysfunction, and pain.   ACTIVITY LIMITATIONS:  self-stimulation  PARTICIPATION LIMITATIONS: interpersonal relationship  PERSONAL FACTORS: 1 comorbidity: medical history  are also affecting patient's functional outcome.   REHAB POTENTIAL: Good  CLINICAL DECISION MAKING: Stable/uncomplicated  EVALUATION COMPLEXITY: Low   GOALS: Goals reviewed with patient? Yes  SHORT TERM GOALS: Target date: 01/10/23 - updated 01/10/23  Pt will be independent with HEP.   Baseline: Goal status: MET 01/10/23  2.  Pt will demonstrate 25% improvement in all lumbar A/ROM without pain.  Baseline:  Goal status: IN PROGRESS  3.  Pt will be independent with perineal massage and performing 4-5x/week. Baseline:  Goal status: IN PROGRESS  4.  Pt will begin using pelvic floor muscle wand 2-3x/week in order to decrease deep pelvic floor tension and pain with penetration.  Baseline:  Goal status: IN PROGRESS   LONG TERM GOALS: Target date: 05/30/22 - updated 01/10/23  Pt will be independent  with advanced HEP.   Baseline:  Goal status: IN PROGRESS  2.  Pt will be able to perform 10 repeat pelvic floor muscle contractions in 10 seconds in order to demonstrate improved coordination.  Baseline:  Goal status: IN PROGRESS  3.  Pt will report no greater than 1/10 pain with vaginal penetration in order to improve intimate relationship with partner.    Baseline:  Goal status: IN PROGRESS  4.  Pt will report successful orgasm with external  stimulation of clitoris in order to improve natural lubrication prior to intercourse.  Baseline:  Goal status: IN PROGRESS    PLAN:  PT FREQUENCY: 1-2x/week  PT DURATION: 6 months  PLANNED INTERVENTIONS: 97110-Therapeutic exercises, 97530- Therapeutic activity, 97112- Neuromuscular re-education, 97535- Self Care, [id number]- Manual therapy, Dry Needling, and Biofeedback  PLAN FOR NEXT SESSION: Mobility activities and hip stretches.    Julio Alm, PT, DPT12/06/2410:28 PM

## 2023-02-08 LAB — SURGICAL PATHOLOGY

## 2023-02-09 ENCOUNTER — Encounter: Payer: Self-pay | Admitting: Obstetrics and Gynecology

## 2023-02-09 DIAGNOSIS — L900 Lichen sclerosus et atrophicus: Secondary | ICD-10-CM

## 2023-02-13 MED ORDER — CLOBETASOL PROPIONATE 0.05 % EX OINT
1.0000 | TOPICAL_OINTMENT | CUTANEOUS | 11 refills | Status: AC
Start: 2023-02-15 — End: ?

## 2023-03-02 ENCOUNTER — Ambulatory Visit: Payer: Medicare Other | Attending: Obstetrics and Gynecology

## 2023-03-02 DIAGNOSIS — M62838 Other muscle spasm: Secondary | ICD-10-CM | POA: Diagnosis present

## 2023-03-02 DIAGNOSIS — M6281 Muscle weakness (generalized): Secondary | ICD-10-CM | POA: Diagnosis present

## 2023-03-02 DIAGNOSIS — R279 Unspecified lack of coordination: Secondary | ICD-10-CM | POA: Insufficient documentation

## 2023-03-02 DIAGNOSIS — R293 Abnormal posture: Secondary | ICD-10-CM | POA: Diagnosis present

## 2023-03-02 NOTE — Therapy (Signed)
 OUTPATIENT PHYSICAL THERAPY FEMALE PELVIC TREATMENT   Patient Name: Maria Terrell MRN: [id number] DOB:1966/01/22, 58 y.o., female Today's Date: 03/02/2023  END OF SESSION:  PT End of Session - 03/02/23 1103     Visit Number 5    Date for PT Re-Evaluation 05/30/23    Authorization Type [employer]    PT Start Time 1100    PT Stop Time 1140    PT Time Calculation (min) 40 min    Activity Tolerance Patient tolerated treatment well    Behavior During Therapy WFL for tasks assessed/performed              Past Medical History:  Diagnosis Date   Allergy    Constipation    after gastric surgery having issues    Depression    Endometriosis    Sleep apnea    wears cpap   Thyroid  goiter    Past Surgical History:  Procedure Laterality Date   BIOPSY THYROID      BREAST BIOPSY Left    BREAST BIOPSY Right    CHOLECYSTECTOMY  1999   COLONOSCOPY     15 yrs ago   LAPAROSCOPIC GASTRIC BAND REMOVAL WITH LAPAROSCOPIC GASTRIC SLEEVE RESECTION  12/2014   LAPAROTOMY     LEFT OOPHORECTOMY     Patient Active Problem List   Diagnosis Date Noted   Dependence on CPAP ventilation 04/11/2021   Obesity (BMI 35.0-39.9 without comorbidity) 04/11/2021   History of mononucleosis 05/09/2017   Depression 05/09/2017   OSA on CPAP 05/09/2017   Chronic fatigue 05/09/2017   Hypothyroidism due to Hashimoto's thyroiditis 05/09/2017   Class 2 severe obesity with serious comorbidity and body mass index (BMI) of 37.0 to 37.9 in adult (HCC) 05/09/2017   Pain in joint, ankle and foot 07/10/2013   Excessive sleepiness 07/03/2012   Goiter 07/03/2012   Visit for preventive health examination 07/03/2012   Plantar fasciitis of right foot 04/16/2012   History of international travel 04/09/2012   OTHER SPECIFIED DISORDER OF SKIN 01/28/2010   CARPAL TUNNEL SYNDROME, RIGHT 10/22/2009   IRON DEFICIENCY 07/28/2009   DIARRHEA 12/31/2008   OTHER SPECIFIED DISORDER OF RECTUM AND ANUS 07/24/2008   PRURITUS, ANAL  07/24/2008   SHOULDER PAIN, LEFT 01/24/2008   OBESITY 04/19/2007   LOW BACK PAIN, CHRONIC 03/13/2007   OBSTRUCTIVE SLEEP APNEA 02/26/2007   FATIGUE 01/04/2007   Enlarged lymph nodes 12/20/2006   INTERTRIGO, CANDIDAL 11/16/2006   ABNORMAL RESULT, FUNCTION STUDY, THYROID  11/16/2006   DEPRESSION 09/06/2006   Allergic rhinitis 09/06/2006   Endometriosis 09/06/2006    PCP: Rolinda Millman, MD  REFERRING PROVIDER: Marilynne Rosaline SAILOR, MD   REFERRING DIAG: N94.10 (ICD-10-CM) - Dyspareunia, female  THERAPY DIAG:  Abnormal posture  Muscle weakness (generalized)  Other muscle spasm  Unspecified lack of coordination  Rationale for Evaluation and Treatment: Rehabilitation  ONSET DATE: 9 months  SUBJECTIVE:  SUBJECTIVE STATEMENT: Pt states that she has been using aquaphor to help skin, but not much other intentional time working on pelvic floor. 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 She has been prescribed new ointment to use, but has not started yet.   PAIN:  Are you having pain? Yes NPRS scale: 5/10 Pain location: Vaginal; low back  Pain type: aching; burning, itching, tearing Pain description:  intermittent      Aggravating factors: intercourse/vaginal penetration Relieving factors: no vaginal penetration  PRECAUTIONS: None  RED FLAGS: None   WEIGHT BEARING RESTRICTIONS: No  FALLS:  Has patient fallen in last 6 months? 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   PLOF: Independent  PATIENT GOALS: to be able to have pain free intimacy   PERTINENT HISTORY:  Lichens sclerosis, endometriosis, gastric band, laparotomy with cyst removal and Lt oophorectomy, cholecystectomy,   BOWEL MOVEMENT: Pain with bowel  movement: No Type of bowel movement:Frequency 1-2x/day and Strain No Fully empty rectum: No Leakage: No - typically non, but does have urgency Pads: No Fiber supplement: No  URINATION: Pain with urination: No Fully empty bladder: Yes: - Stream: Strong Urgency: No Frequency: 3-4x/day; 0x/night Leakage:  none Pads: No  INTERCOURSE: Pain with intercourse: Initial Penetration and During Penetration Ability to have vaginal penetration:  Yes: pushes through pain Climax: Unsure if she has ever had one Marinoff Scale: 2/3  PREGNANCY: NA  PROLAPSE: None   OBJECTIVE:  Note: Objective measures were completed at Evaluation unless otherwise noted. 12/13/22  COGNITION: Overall cognitive status: Within functional limits for tasks assessed     SENSATION: Light touch: Appears intact Proprioception: Appears intact  FUNCTIONAL TESTS:  Breath holding with bed mobility   GAIT: Comments: Mild bil compensated trendelenburg  POSTURE: rounded shoulders, forward head, increased lumbar lordosis, increased thoracic kyphosis, and anterior pelvic tilt  LUMBARAROM/PROM:  A/PROM A/PROM  Eval (% available)  Flexion 50  Extension 50  Right lateral flexion 75  Left lateral flexion 75  Right rotation 50  Left rotation 50   (Blank rows = not tested)    PALPATION:   General  Scar tissue palpation in lower Lt quadrant with tenderness; in general abdominal restriction                External Perineal Exam evidence of lichens sclerosis plaques on vulva and anus; fragile vulvar tissue with redness; evidence of vulvar atrophy and labial fusion                             Internal Pelvic Floor mild tenderness throughout superficial pelvic floor; aching and muscle spasm in bil levator ani  Patient confirms identification and approves PT to assess internal pelvic floor and treatment Yes  PELVIC MMT:   MMT eval  Vaginal 3/5, 4 second hold, 4 repeat contractions  Diastasis Recti No  diastasis, no distortion with increased abdominal pressure  (Blank rows = not tested)        TONE: high  PROLAPSE: Not tested  TODAY'S TREATMENT:  DATE:  03/02/23 Exercises: Lower trunk rotation 2 x 10 Open books 10x bil Single knee to chest 10x bil Seated forward fold 2 min Seated lateral flexion 2 min bil Seated hip adduction ball press with transversus abdominus and pelvic floor muscle 2 x 10 Seated hip abduction red band with transversus abdominus and pelvic floor muscle 2 x 10 Seated resisted march red band with transversus abdominus and pelvic floor muscle 2 x 10   02/01/23 Manual: Pt provides verbal consent for internal vaginal/rectal pelvic floor exam. Internal superficial and deep vaginal pelvic floor muscle release in supine  Myofascial release at posterior fourchette  Neuromuscular re-education: Diaphragmatic breathing with pelvic floor muscle bulge/relaxation with internal vaginal feedback and multimodal cue Therapeutic activities: Husband education on desensitization/mobilization techniques, foreplay, importance of orgasm    01/10/23 Manual: Pt provides verbal consent for internal vaginal/rectal pelvic floor exam. Internal superficial and deep vaginal pelvic floor muscle release in supine  Myofascial release at posterior fourchette  Neuromuscular re-education: Diaphragmatic breathing with pelvic floor muscle bulge/relaxation with internal vaginal feedback and multimodal cues Therapeutic activities: Vulvovaginal massage Lubricant review Working with partner on vaginal/vulvar desensitization   PATIENT EDUCATION:  Education details: See above Person educated: Patient Education method: Programmer, Multimedia, Facilities Manager, Actor cues, Verbal cues, and Handouts Education comprehension: verbalized understanding  HOME EXERCISE  PROGRAM:   ASSESSMENT:  CLINICAL IMPRESSION: Pt has had a little difficulty being consistent with exercises and working on pelvic floor muscle desensitization. She is also having more low back pain and questions whether this is impacting pelvic floor muscle. We discussed how increased pain and tightness weakness in core muscles does impact pelvic floor muscle tension/pain and intercourse. We focused on mobility activities and gentle strengthening today with very good tolerance. She was a little concerned about lower trunk rotation due to guidance to avoid twisting, but she was assured that this is a very safe and supported place to work on gentle mobilizing the spine and hips. She did very well with all exercises and HEP updated. She will continue to benefit from skilled PT intervention in order to decrease dyspareunia, improve pelvic floor length/tension relationship, and begin/progress functional strengthening program.   OBJECTIVE IMPAIRMENTS: decreased activity tolerance, decreased coordination, decreased endurance, decreased strength, increased fascial restrictions, increased muscle spasms, impaired tone, postural dysfunction, and pain.   ACTIVITY LIMITATIONS:  self-stimulation  PARTICIPATION LIMITATIONS: interpersonal relationship  PERSONAL FACTORS: 1 comorbidity: medical history  are also affecting patient's functional outcome.   REHAB POTENTIAL: Good  CLINICAL DECISION MAKING: Stable/uncomplicated  EVALUATION COMPLEXITY: Low   GOALS: Goals reviewed with patient? Yes  SHORT TERM GOALS: Target date: 01/10/23 - updated 01/10/23 - updated 03/02/23  Pt will be independent with HEP.   Baseline: Goal status: MET 01/10/23  2.  Pt will demonstrate 25% improvement in all lumbar A/ROM without pain.  Baseline:  Goal status: IN PROGRESS  3.  Pt will be independent with perineal massage and performing 4-5x/week. Baseline:  Goal status: IN PROGRESS  4.  Pt will begin using pelvic floor  muscle wand 2-3x/week in order to decrease deep pelvic floor tension and pain with penetration.  Baseline:  Goal status: IN PROGRESS   LONG TERM GOALS: Target date: 05/30/22 - updated 01/10/23 - updated 03/02/23  Pt will be independent with advanced HEP.   Baseline:  Goal status: IN PROGRESS  2.  Pt will be able to perform 10 repeat pelvic floor muscle contractions in 10 seconds in order to demonstrate improved coordination.  Baseline:  Goal status:  IN PROGRESS  3.  Pt will report no greater than 1/10 pain with vaginal penetration in order to improve intimate relationship with partner.    Baseline:  Goal status: IN PROGRESS  4.  Pt will report successful orgasm with external stimulation of clitoris in order to improve natural lubrication prior to intercourse.  Baseline:  Goal status: IN PROGRESS    PLAN:  PT FREQUENCY: 1-2x/week  PT DURATION: 6 months  PLANNED INTERVENTIONS: 97110-Therapeutic exercises, 97530- Therapeutic activity, 97112- Neuromuscular re-education, 97535- Self Care, [id number]- Manual therapy, Dry Needling, and Biofeedback  PLAN FOR NEXT SESSION: Mobility activities and hip stretches. Re-evaluation.    Josette Mares, PT, DPT01/04/2509:03 AM

## 2023-03-08 ENCOUNTER — Ambulatory Visit: Payer: Medicare Other

## 2023-03-08 DIAGNOSIS — M6281 Muscle weakness (generalized): Secondary | ICD-10-CM

## 2023-03-08 DIAGNOSIS — R279 Unspecified lack of coordination: Secondary | ICD-10-CM

## 2023-03-08 DIAGNOSIS — M62838 Other muscle spasm: Secondary | ICD-10-CM

## 2023-03-08 DIAGNOSIS — R293 Abnormal posture: Secondary | ICD-10-CM | POA: Diagnosis not present

## 2023-03-08 NOTE — Therapy (Signed)
 OUTPATIENT PHYSICAL THERAPY FEMALE PELVIC TREATMENT   Patient Name: Maria Terrell MRN: [id number] DOB:03-17-1965, 58 y.o., female Today's Date: 03/08/2023  END OF SESSION:  PT End of Session - 03/08/23 1152     Visit Number 6    Date for PT Re-Evaluation 05/30/23    Authorization Type [employer]    PT Start Time 1149    PT Stop Time 1227    PT Time Calculation (min) 38 min    Activity Tolerance Patient tolerated treatment well    Behavior During Therapy WFL for tasks assessed/performed              Past Medical History:  Diagnosis Date   Allergy    Constipation    after gastric surgery having issues    Depression    Endometriosis    Sleep apnea    wears cpap   Thyroid  goiter    Past Surgical History:  Procedure Laterality Date   BIOPSY THYROID      BREAST BIOPSY Left    BREAST BIOPSY Right    CHOLECYSTECTOMY  1999   COLONOSCOPY     15 yrs ago   LAPAROSCOPIC GASTRIC BAND REMOVAL WITH LAPAROSCOPIC GASTRIC SLEEVE RESECTION  12/2014   LAPAROTOMY     LEFT OOPHORECTOMY     Patient Active Problem List   Diagnosis Date Noted   Dependence on CPAP ventilation 04/11/2021   Obesity (BMI 35.0-39.9 without comorbidity) 04/11/2021   History of mononucleosis 05/09/2017   Depression 05/09/2017   OSA on CPAP 05/09/2017   Chronic fatigue 05/09/2017   Hypothyroidism due to Hashimoto's thyroiditis 05/09/2017   Class 2 severe obesity with serious comorbidity and body mass index (BMI) of 37.0 to 37.9 in adult (HCC) 05/09/2017   Pain in joint, ankle and foot 07/10/2013   Excessive sleepiness 07/03/2012   Goiter 07/03/2012   Visit for preventive health examination 07/03/2012   Plantar fasciitis of right foot 04/16/2012   History of international travel 04/09/2012   OTHER SPECIFIED DISORDER OF SKIN 01/28/2010   CARPAL TUNNEL SYNDROME, RIGHT 10/22/2009   IRON DEFICIENCY 07/28/2009   DIARRHEA 12/31/2008   OTHER SPECIFIED DISORDER OF RECTUM AND ANUS 07/24/2008   PRURITUS, ANAL  07/24/2008   SHOULDER PAIN, LEFT 01/24/2008   OBESITY 04/19/2007   LOW BACK PAIN, CHRONIC 03/13/2007   OBSTRUCTIVE SLEEP APNEA 02/26/2007   FATIGUE 01/04/2007   Enlarged lymph nodes 12/20/2006   INTERTRIGO, CANDIDAL 11/16/2006   ABNORMAL RESULT, FUNCTION STUDY, THYROID  11/16/2006   DEPRESSION 09/06/2006   Allergic rhinitis 09/06/2006   Endometriosis 09/06/2006    PCP: Rolinda Millman, MD  REFERRING PROVIDER: Marilynne Rosaline SAILOR, MD   REFERRING DIAG: N94.10 (ICD-10-CM) - Dyspareunia, female  THERAPY DIAG:  Abnormal posture  Muscle weakness (generalized)  Other muscle spasm  Unspecified lack of coordination  Rationale for Evaluation and Treatment: Rehabilitation  ONSET DATE: 9 months  SUBJECTIVE:  SUBJECTIVE STATEMENT: Pt said that she worked on being intimate with partner last night using vibrator. She tried several different lubricants and feels like she was not successful. She feels   PAIN:  Are you having pain? Yes NPRS scale: 5/10 Pain location: Vaginal; low back  Pain type: aching; burning, itching, tearing Pain description:  intermittent      Aggravating factors: intercourse/vaginal penetration Relieving factors: no vaginal penetration  PRECAUTIONS: None  RED FLAGS: None   WEIGHT BEARING RESTRICTIONS: No  FALLS:  Has patient fallen in last 6 months? 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   PLOF: Independent  PATIENT GOALS: to be able to have pain free intimacy   PERTINENT HISTORY:  Lichens sclerosis, endometriosis, gastric band, laparotomy with cyst removal and Lt oophorectomy, cholecystectomy,   BOWEL MOVEMENT: Pain with bowel movement: No Type of bowel movement:Frequency 1-2x/day  and Strain No Fully empty rectum: No Leakage: No - typically non, but does have urgency Pads: No Fiber supplement: No  URINATION: Pain with urination: No Fully empty bladder: Yes: - Stream: Strong Urgency: No Frequency: 3-4x/day; 0x/night Leakage:  none Pads: No  INTERCOURSE: Pain with intercourse: Initial Penetration and During Penetration Ability to have vaginal penetration:  Yes: pushes through pain Climax: Unsure if she has ever had one Marinoff Scale: 2/3  PREGNANCY: NA  PROLAPSE: None   OBJECTIVE:  Note: Objective measures were completed at Evaluation unless otherwise noted. 12/13/22  COGNITION: Overall cognitive status: Within functional limits for tasks assessed     SENSATION: Light touch: Appears intact Proprioception: Appears intact  FUNCTIONAL TESTS:  Breath holding with bed mobility   GAIT: Comments: Mild bil compensated trendelenburg  POSTURE: rounded shoulders, forward head, increased lumbar lordosis, increased thoracic kyphosis, and anterior pelvic tilt  LUMBARAROM/PROM:  A/PROM A/PROM  Eval (% available)  Flexion 50  Extension 50  Right lateral flexion 75  Left lateral flexion 75  Right rotation 50  Left rotation 50   (Blank rows = not tested)    PALPATION:   General  Scar tissue palpation in lower Lt quadrant with tenderness; in general abdominal restriction                External Perineal Exam evidence of lichens sclerosis plaques on vulva and anus; fragile vulvar tissue with redness; evidence of vulvar atrophy and labial fusion                             Internal Pelvic Floor mild tenderness throughout superficial pelvic floor; aching and muscle spasm in bil levator ani  Patient confirms identification and approves PT to assess internal pelvic floor and treatment Yes  PELVIC MMT:   MMT eval  Vaginal 3/5, 4 second hold, 4 repeat contractions  Diastasis Recti No diastasis, no distortion with increased abdominal pressure   (Blank rows = not tested)        TONE: high  PROLAPSE: Not tested  TODAY'S TREATMENT:  DATE:  03/08/23 Neuromuscular re-education: Seated hip adduction ball press with transversus abdominus and pelvic floor muscle 2 x 10 Seated hip abduction red band with transversus abdominus and pelvic floor muscle 2 x 10 Seated resisted march red band with transversus abdominus and pelvic floor muscle 2 x 10  Therapeutic activities: Continued orgasm discussion: where to focus vibrator, working towards orgasm Frequency of use of vibrator HEP review   03/02/23 Exercises: Lower trunk rotation 2 x 10 Open books 10x bil Single knee to chest 10x bil Seated forward fold 2 min Seated lateral flexion 2 min bil Seated hip adduction ball press with transversus abdominus and pelvic floor muscle 2 x 10 Seated hip abduction red band with transversus abdominus and pelvic floor muscle 2 x 10 Seated resisted march red band with transversus abdominus and pelvic floor muscle 2 x 10   02/01/23 Manual: Pt provides verbal consent for internal vaginal/rectal pelvic floor exam. Internal superficial and deep vaginal pelvic floor muscle release in supine  Myofascial release at posterior fourchette  Neuromuscular re-education: Diaphragmatic breathing with pelvic floor muscle bulge/relaxation with internal vaginal feedback and multimodal cue Therapeutic activities: Husband education on desensitization/mobilization techniques, foreplay, importance of orgasm    PATIENT EDUCATION:  Education details: See above Person educated: Patient Education method: Explanation, Demonstration, Tactile cues, Verbal cues, and Handouts Education comprehension: verbalized understanding  HOME EXERCISE PROGRAM:   ASSESSMENT:  CLINICAL IMPRESSION: Pt is doing well overall. She was able to work with 2  different vibrators last night with partner and did not experience any pain. She was not able to achieve orgasm. We discussed performing on more regular basis to help neural connections and muscles. We reviewed HEP and it was consolidated in one print out. We reviewed seated core/hip strengthening activities; she demonstrates good form with minor cues for upright posture. She will continue to benefit from skilled PT intervention in order to decrease dyspareunia, improve pelvic floor length/tension relationship, and begin/progress functional strengthening program.   OBJECTIVE IMPAIRMENTS: decreased activity tolerance, decreased coordination, decreased endurance, decreased strength, increased fascial restrictions, increased muscle spasms, impaired tone, postural dysfunction, and pain.   ACTIVITY LIMITATIONS:  self-stimulation  PARTICIPATION LIMITATIONS: interpersonal relationship  PERSONAL FACTORS: 1 comorbidity: medical history  are also affecting patient's functional outcome.   REHAB POTENTIAL: Good  CLINICAL DECISION MAKING: Stable/uncomplicated  EVALUATION COMPLEXITY: Low   GOALS: Goals reviewed with patient? Yes  SHORT TERM GOALS: Target date: 01/10/23 - updated 01/10/23 - updated 03/02/23  Pt will be independent with HEP.   Baseline: Goal status: MET 01/10/23  2.  Pt will demonstrate 25% improvement in all lumbar A/ROM without pain.  Baseline:  Goal status: IN PROGRESS  3.  Pt will be independent with perineal massage and performing 4-5x/week. Baseline:  Goal status: IN PROGRESS  4.  Pt will begin using pelvic floor muscle wand 2-3x/week in order to decrease deep pelvic floor tension and pain with penetration.  Baseline:  Goal status: IN PROGRESS   LONG TERM GOALS: Target date: 05/30/22 - updated 01/10/23 - updated 03/02/23  Pt will be independent with advanced HEP.   Baseline:  Goal status: IN PROGRESS  2.  Pt will be able to perform 10 repeat pelvic floor muscle  contractions in 10 seconds in order to demonstrate improved coordination.  Baseline:  Goal status: IN PROGRESS  3.  Pt will report no greater than 1/10 pain with vaginal penetration in order to improve intimate relationship with partner.    Baseline:  Goal status: IN  PROGRESS  4.  Pt will report successful orgasm with external stimulation of clitoris in order to improve natural lubrication prior to intercourse.  Baseline:  Goal status: IN PROGRESS    PLAN:  PT FREQUENCY: 1-2x/week  PT DURATION: 6 months  PLANNED INTERVENTIONS: 97110-Therapeutic exercises, 97530- Therapeutic activity, 97112- Neuromuscular re-education, 97535- Self Care, [id number]- Manual therapy, Dry Needling, and Biofeedback  PLAN FOR NEXT SESSION: Mobility activities and hip stretches. Re-evaluation.    Josette Mares, PT, DPT01/10/2510:27 PM

## 2023-03-12 ENCOUNTER — Ambulatory Visit: Payer: Medicare Other

## 2023-03-12 DIAGNOSIS — R293 Abnormal posture: Secondary | ICD-10-CM

## 2023-03-12 DIAGNOSIS — M6281 Muscle weakness (generalized): Secondary | ICD-10-CM

## 2023-03-12 DIAGNOSIS — M62838 Other muscle spasm: Secondary | ICD-10-CM

## 2023-03-12 DIAGNOSIS — R279 Unspecified lack of coordination: Secondary | ICD-10-CM

## 2023-03-12 NOTE — Therapy (Addendum)
 OUTPATIENT PHYSICAL THERAPY FEMALE PELVIC TREATMENT   Patient Name: Maria Terrell MRN: [id number] DOB:[birth year], 58 y.o., female Today's Date: 03/12/2023  END OF SESSION:  PT End of Session - 03/12/23 1616     Visit Number 7    Date for PT Re-Evaluation 05/07/23    Authorization Type [employer]    PT Start Time 1615    PT Stop Time 1655    PT Time Calculation (min) 40 min    Activity Tolerance Patient tolerated treatment well    Behavior During Therapy WFL for tasks assessed/performed               Past Medical History:  Diagnosis Date   Allergy    Constipation    after gastric surgery having issues    Depression    Endometriosis    Sleep apnea    wears cpap   Thyroid  goiter    Past Surgical History:  Procedure Laterality Date   BIOPSY THYROID      BREAST BIOPSY Left    BREAST BIOPSY Right    CHOLECYSTECTOMY  1999   COLONOSCOPY     15 yrs ago   LAPAROSCOPIC GASTRIC BAND REMOVAL WITH LAPAROSCOPIC GASTRIC SLEEVE RESECTION  12/2014   LAPAROTOMY     LEFT OOPHORECTOMY     Patient Active Problem List   Diagnosis Date Noted   Dependence on CPAP ventilation 04/11/2021   Obesity (BMI 35.0-39.9 without comorbidity) 04/11/2021   History of mononucleosis 05/09/2017   Depression 05/09/2017   OSA on CPAP 05/09/2017   Chronic fatigue 05/09/2017   Hypothyroidism due to Hashimoto's thyroiditis 05/09/2017   Class 2 severe obesity with serious comorbidity and body mass index (BMI) of 37.0 to 37.9 in adult (HCC) 05/09/2017   Pain in joint, ankle and foot 07/10/2013   Excessive sleepiness 07/03/2012   Goiter 07/03/2012   Visit for preventive health examination 07/03/2012   Plantar fasciitis of right foot 04/16/2012   History of international travel 04/09/2012   OTHER SPECIFIED DISORDER OF SKIN 01/28/2010   CARPAL TUNNEL SYNDROME, RIGHT 10/22/2009   IRON DEFICIENCY 07/28/2009   DIARRHEA 12/31/2008   OTHER SPECIFIED DISORDER OF RECTUM AND ANUS 07/24/2008   PRURITUS,  ANAL 07/24/2008   SHOULDER PAIN, LEFT 01/24/2008   OBESITY 04/19/2007   LOW BACK PAIN, CHRONIC 03/13/2007   OBSTRUCTIVE SLEEP APNEA 02/26/2007   FATIGUE 01/04/2007   Enlarged lymph nodes 12/20/2006   INTERTRIGO, CANDIDAL 11/16/2006   ABNORMAL RESULT, FUNCTION STUDY, THYROID  11/16/2006   DEPRESSION 09/06/2006   Allergic rhinitis 09/06/2006   Endometriosis 09/06/2006    PCP: Rolinda Millman, MD  REFERRING PROVIDER: Marilynne Rosaline SAILOR, MD   REFERRING DIAG: N94.10 (ICD-10-CM) - Dyspareunia, female  THERAPY DIAG:  Abnormal posture  Muscle weakness (generalized)  Other muscle spasm  Unspecified lack of coordination  Rationale for Evaluation and Treatment: Rehabilitation  ONSET DATE: 9 months  SUBJECTIVE:  SUBJECTIVE STATEMENT: Pt states that she was not able to work with husband at home with intimate activities and she did not do this on her own either.   PAIN:  Are you having pain? Yes NPRS scale: 5/10 Pain location: Vaginal; low back  Pain type: aching; burning, itching, tearing Pain description: intermittent     Aggravating factors: intercourse/vaginal penetration Relieving factors: no vaginal penetration  PRECAUTIONS: None  RED FLAGS: None   WEIGHT BEARING RESTRICTIONS: No  FALLS:  Has patient fallen in last 6 months? No  LIVING ENVIRONMENT: Lives with: lives with their family Lives in: House/apartment  OCCUPATION: Corporate investment banker   PLOF: Independent  PATIENT GOALS: to be able to have pain free intimacy   PERTINENT HISTORY:  Lichens sclerosis, endometriosis, gastric band, laparotomy with cyst removal and Lt oophorectomy, cholecystectomy,   BOWEL MOVEMENT: Pain with bowel movement: No Type of bowel movement:Frequency 1-2x/day and Strain No Fully empty rectum:  No Leakage: No - typically non, but does have urgency Pads: No Fiber supplement: No  URINATION: Pain with urination: No Fully empty bladder: Yes: - Stream: Strong Urgency: No Frequency: 3-4x/day; 0x/night Leakage: none Pads: No  INTERCOURSE: Pain with intercourse: Initial Penetration and During Penetration Ability to have vaginal penetration:  Yes: pushes through pain Climax: Unsure if she has ever had one Marinoff Scale: 2/3  PREGNANCY: NA  PROLAPSE: None   OBJECTIVE:  Note: Objective measures were completed at Evaluation unless otherwise noted. 03/12/23 Internal vaginal assessment:  Tenderness and restriction at posterior fourchette and perineal body  Significant restriction in bil deep pelvic floor muscle (Rt>Lt) Restriction at coccygeal attachments of bil pelvic floor muscle  Lack of normal vulvar redness with lesions and fusion present    12/13/22  COGNITION: Overall cognitive status: Within functional limits for tasks assessed     SENSATION: Light touch: Appears intact Proprioception: Appears intact  FUNCTIONAL TESTS:  Breath holding with bed mobility   GAIT: Comments: Mild bil compensated trendelenburg  POSTURE: rounded shoulders, forward head, increased lumbar lordosis, increased thoracic kyphosis, and anterior pelvic tilt  LUMBARAROM/PROM:  A/PROM A/PROM  Eval (% available)  Flexion 50  Extension 50  Right lateral flexion 75  Left lateral flexion 75  Right rotation 50  Left rotation 50   (Blank rows = not tested)    PALPATION:   General  Scar tissue palpation in lower Lt quadrant with tenderness; in general abdominal restriction                External Perineal Exam evidence of lichens sclerosis plaques on vulva and anus; fragile vulvar tissue with redness; evidence of vulvar atrophy and labial fusion                             Internal Pelvic Floor mild tenderness throughout superficial pelvic floor; aching and muscle spasm in bil  levator ani  Patient confirms identification and approves PT to assess internal pelvic floor and treatment Yes  PELVIC MMT:   MMT eval  Vaginal 3/5, 4 second hold, 4 repeat contractions  Diastasis Recti No diastasis, no distortion with increased abdominal pressure  (Blank rows = not tested)        TONE: high  PROLAPSE: Not tested  TODAY'S TREATMENT:  DATE:  03/12/23 RE-EVAL Manual: Pt provides verbal consent for internal vaginal/rectal pelvic floor exam. Superficial vaginal desensitization Posterior fourchette/perineal body mobilization  Superficial and deep pelvic floor muscle release vaginally  Therapeutic activities: Dilator education, encouraged to bring in next session   03/08/23 Neuromuscular re-education: Seated hip adduction ball press with transversus abdominus and pelvic floor muscle 2 x 10 Seated hip abduction red band with transversus abdominus and pelvic floor muscle 2 x 10 Seated resisted march red band with transversus abdominus and pelvic floor muscle 2 x 10  Therapeutic activities: Continued orgasm discussion: where to focus vibrator, working towards orgasm Frequency of use of vibrator HEP review   03/02/23 Exercises: Lower trunk rotation 2 x 10 Open books 10x bil Single knee to chest 10x bil Seated forward fold 2 min Seated lateral flexion 2 min bil Seated hip adduction ball press with transversus abdominus and pelvic floor muscle 2 x 10 Seated hip abduction red band with transversus abdominus and pelvic floor muscle 2 x 10 Seated resisted march red band with transversus abdominus and pelvic floor muscle 2 x 10     PATIENT EDUCATION:  Education details: See above Person educated: Patient Education method: Programmer, multimedia, Demonstration, Actor cues, Verbal cues, and Handouts Education comprehension: verbalized  understanding  HOME EXERCISE PROGRAM:   ASSESSMENT:  CLINICAL IMPRESSION: Due to change in insurance, we are doing re-evaluation today. Pt has started working towards self-stimulation, but only with partner at this time and frequency low; she was encouraged that more frequent stimulation may be helpful. We returned to internal vaginal desensitization this session with some improvements in tissue mobility, but overall still notable restriction and discomfort. Believe that her superficial restriciton likely has more to do with lichens sclerosis making skin tender and leading to adhesions at posterior fourchette. Deep pelvic floor muscle tension is more likely due to chronic low back back and deep core weakness due to significant tension in piriformis specifically. She did demonstrate more restriction and discomfort on Rt side compared to Lt. Believe she is starting to make progress, but improved compliance with HEP, work on self-desensitization, and intimate activities with partner will help improve rehabilitation. We did begin discussing dilators today and she was encouraged to bring to next session. She will continue to benefit from skilled PT intervention in order to decrease dyspareunia, improve pelvic floor length/tension relationship, and begin/progress functional strengthening program.   OBJECTIVE IMPAIRMENTS: decreased activity tolerance, decreased coordination, decreased endurance, decreased strength, increased fascial restrictions, increased muscle spasms, impaired tone, postural dysfunction, and pain.   ACTIVITY LIMITATIONS: self-stimulation  PARTICIPATION LIMITATIONS: interpersonal relationship  PERSONAL FACTORS: 1 comorbidity: medical history are also affecting patient's functional outcome.   REHAB POTENTIAL: Good  CLINICAL DECISION MAKING: Stable/uncomplicated  EVALUATION COMPLEXITY: Low   GOALS: Goals reviewed with patient? Yes  SHORT TERM GOALS: Target date: 01/10/23 -  updated 01/10/23 - updated 03/02/23  Pt will be independent with HEP.   Baseline: Goal status: MET 01/10/23  2.  Pt will demonstrate 25% improvement in all lumbar A/ROM without pain.  Baseline:  Goal status: IN PROGRESS 03/12/23  3.  Pt will be independent with perineal massage and performing 4-5x/week. Baseline:  Goal status: IN PROGRESS 03/12/23  4.  Pt will begin using pelvic floor muscle wand 2-3x/week in order to decrease deep pelvic floor tension and pain with penetration.  Baseline:  Goal status: IN PROGRESS 03/12/23   LONG TERM GOALS: Target date: 05/30/22 - updated 01/10/23 - updated 03/02/23 - updated 03/12/23  Pt will  be independent with advanced HEP.   Baseline:  Goal status: IN PROGRESS 03/12/23  2.  Pt will be able to perform 10 repeat pelvic floor muscle contractions in 10 seconds in order to demonstrate improved coordination.  Baseline:  Goal status: IN PROGRESS 03/12/23  3.  Pt will report no greater than 1/10 pain with vaginal penetration in order to improve intimate relationship with partner.    Baseline:  Goal status: IN PROGRESS 03/12/23  4.  Pt will report successful orgasm with external stimulation of clitoris in order to improve natural lubrication prior to intercourse.  Baseline:  Goal status: IN PROGRESS 03/12/23    PLAN:  PT FREQUENCY: 1-2x/week  PT DURATION: 8 weeks  PLANNED INTERVENTIONS: 97110-Therapeutic exercises, 97530- Therapeutic activity, 97112- Neuromuscular re-education, 97535- Self Care, [id number]- Manual therapy, Dry Needling, and Biofeedback  PLAN FOR NEXT SESSION: Mobility activities and hip stretches. Vaginal desensitization and pelvic floor muscle release.   Josette Mares, PT, DPT01/13/254:17 PM  PHYSICAL THERAPY DISCHARGE SUMMARY  Visits from Start of Care: 7  Current functional level related to goals / functional outcomes: Unknown   Remaining deficits: See above   Education / Equipment: HEP   Patient agrees to discharge.  Patient goals were partially met. Patient is being discharged due to not returning since the last visit.  Josette Mares, PT, DPT08/05/2508:45 AM

## 2023-03-26 ENCOUNTER — Ambulatory Visit: Payer: Medicare Other

## 2023-04-02 ENCOUNTER — Ambulatory Visit: Payer: Medicare Other

## 2023-04-04 ENCOUNTER — Encounter: Payer: Self-pay | Admitting: Obstetrics and Gynecology

## 2023-04-19 ENCOUNTER — Ambulatory Visit: Payer: Medicare Other

## 2023-04-26 ENCOUNTER — Encounter: Payer: Self-pay | Admitting: Obstetrics and Gynecology

## 2023-04-26 NOTE — Telephone Encounter (Signed)
 Please advise. I've updated patient chart with the partial oophorectomy.

## 2023-05-04 ENCOUNTER — Ambulatory Visit: Payer: 59 | Admitting: Obstetrics and Gynecology

## 2023-05-04 ENCOUNTER — Encounter: Payer: Self-pay | Admitting: Obstetrics and Gynecology

## 2023-05-04 DIAGNOSIS — L900 Lichen sclerosus et atrophicus: Secondary | ICD-10-CM | POA: Diagnosis not present

## 2023-05-04 NOTE — Progress Notes (Signed)
 [facility] Return Visit  SUBJECTIVE  History of Present Illness: Maria Terrell is a 57 y.o. female seen in follow-up for lichen sclerosus and dyspareunia. Plan at last visit was to use clobetasol, estrogen cream and emla cream. Was also referred to pelvic PT.   Clobetasol ointment has been working well. Feels less irritated. She is using the estrogen cream internally and in the vulva. She is using egyptian magic and v-magic moisturizer for her vulva. She has not tried having intercourse (previously using silicone lubricant). Had been attending physical therapy but less frequent than she would due to her mother's medical appts.   She had some vaginal bleeding. She has an ultrasound with biopsy scheduled with Dr Senaida Ores.   Past Medical History: Patient  has a past medical history of Allergy, Constipation, Depression, Endometriosis, Sleep apnea, and Thyroid goiter.   Past Surgical History: She  has a past surgical history that includes laparotomy; Laparoscopic gastric band removal with laparoscopic gastric sleeve resection (12/2014); Cholecystectomy (1999); Colonoscopy; Biopsy thyroid; Breast biopsy (Left); Breast biopsy (Right); and Left oophorectomy.   Medications: She has a current medication list which includes the following prescription(s): bupropion hcl, cholecalciferol, clobetasol, clobetasol ointment, clobetasol propionate e, vitamin b-12, cyclobenzaprine, estradiol, estradiol, krill oil, levocetirizine, levothyroxine, lidocaine-prilocaine, meloxicam, multivitamin-iron-minerals-folic acid, progesterone, rosuvastatin, valacyclovir, and lactobacillus-inulin, and the following Facility-Administered Medications: lidocaine-epinephrine.   Allergies: Patient is allergic to cefaclor.   Social History: Patient  reports that she has never smoked. She has never used smokeless tobacco. She reports that she does not drink alcohol and does not use drugs.      OBJECTIVE      Physical Exam: There were no vitals filed for this visit.  Gen: No apparent distress, A&O x 3.  Detailed Urogynecologic Evaluation:  Improvement in tissue quality, no white plaques noted on the vulva Mild white plaques near the anus.     ASSESSMENT AND PLAN    Ms. Alarid is a 58 y.o. with:  1. Lichen sclerosus     - Continue with current regimen with clobetasol, vaginal estrogen and vulvar moisturizers.  - continue with pelvic PT and perineal massage  Follow up as needed  Marguerita Beards, MD   Time spent: I spent 20 minutes dedicated to the care of this patient on the date of this encounter to include pre-visit review of records, face-to-face time with the patient and post visit documentation.

## 2023-11-13 NOTE — Progress Notes (Signed)
 Location Information: Patient State (at time of visit): [place]  Patient Location (at time of visit):Home/Other Non-Medical  Provider Location: [place]  Is provider licensed to provide clinical care in the current location/state of the patient? Yes  Consent:  Patient's identity was confirmed. Presenting condition or illness was discussed with the patient/personal representative. Current proposed treatment for presenting condition or illness was explained to patient/personal representative along with the likely benefits and any significant risks or complications associated with the provision of treatment by audio/video means. The patient/personal representative verbally authorized treatment to be provided by audio/video, which may include a limited review of patient's current health status, medication, or other treatment recommendations, patient education, and an opportunity to ask questions about condition and treatment. Verbal Consent Granted by Patient/Personal Representative:Yes   Visit Information: Modality: 2-Way Real-Time Audio/Video  Video Start Time: 3:03 pm Video Stop Time: 3:55 pm Video Total Time: 52 minutes   [facility]: Psychological Therapy Note  *This evaluation note may contain sensitive and confidential information regarding the patient's psychosocial adjustment to living with a chronic medical condition.  DO NOT share this information outside of  [facility] without written consent from the patient explicitly stating that mental health records may be released.  DOS: Tue 11/13/2023 Service: Individual psychotherapy Billing Codes: 09165  Subjective/Objective: Patient shared some frustration with receiving no new information regarding treatment for her mother's mixed dementia.  She recently had an appointment with a neurologist but no new medications or suggestions were provided for treatment.  Discussed 2 options of medications but is not sure if the side  effects would be worth the benefits.  She plans on sending a message to the neuropsychologist she has met with before for more guidance.  She is leaving on her trip on 9/30 and wants to get everything settled prior to her trip since she will be out of the country.  Primary caregiver will stay with her mother and 2 of her brothers will be on call or come to check in on her mother on weekends.  Patient reports being quite fatigued and not sleeping deeply.  She is maintaining her healthy eating during the day but tends to want to snack at night.  She has limited this to popcorn but is hopeful to substitute with mini carrots and hummus at least some of the time.  Suggested making sure protein intake is adequate for dinner and potentially decreasing popcorn from 2 cups to 1 cup.  She has gone on a few walks but less consistently due to many of her mother's appointments and being extremely tired.  Discussed ways to make this more interesting and fun for her.  Follow-up scheduled for 9/29.     Interventions: Supportive expressive techniques to assist patient in tolerating negative affect.  Psychoeducation related to the relationship between interpersonal relationships, weight loss, and food/eating behaviors. Cognitive-behavioral interventions, including cognitive restructuring and behavior modification, related to mood and food/eating behaviors. Problem solving techniques to assist developing a plan and setting goals to assist in maintaining weight loss.   In-Session Response:  Patient responded thoughtfully to in-session interventions.  Mental Status: Appearance: casually dressed Behavior: normal Motor: unremarkable Level of Consciousness:alert Attitude:cooperative Speech:normal pitch and normal volume Mood:euthymic Affect:congruent with mood Thought Process:goal-directed Thought Content:logical Memory: WNL Judgment:good Insight:good  Suicide Assessment: Not assessed  Potential for  Assault/Violence: Not assessed  Diagnostic Impressions:  Anxiety Disorder NOS and Depressive Disorder NOS     Plan of Care*: 1). Monthly sessions of cognitive-behavioral and interpersonally-oriented psychotherapy  to promote weight loss and aid in adjustment to weight loss. GOAL: Decrease maladaptive eating behaviors 50%   2).  Monthly psychotherapy to assist with adjustment to declining health of her mother GOAL: Support patient through adjustment of aging parents   3). Monthly psychotherapy to assist in the development of positive coping skills to manage depression and anxiety GOAL: Decrease symptoms of depression and anxiety  * The patient actively participated in the treatment planning process and goal setting  Thank you for the opportunity to participate in the care of this patient. If you have any questions or would like to discuss, please let me know.   EMERSON Lela Bowen, MS, NCC, [facility] Licensed Clinical Mental Health Counselor [place] License # [id number]  [place] License # [id number] [facility]

## 2023-11-26 ENCOUNTER — Ambulatory Visit: Admitting: Obstetrics and Gynecology

## 2023-11-26 ENCOUNTER — Encounter: Payer: Self-pay | Admitting: Obstetrics and Gynecology

## 2023-11-26 VITALS — BP 117/79 | HR 71

## 2023-11-26 DIAGNOSIS — N898 Other specified noninflammatory disorders of vagina: Secondary | ICD-10-CM

## 2023-11-26 NOTE — Progress Notes (Signed)
 [facility] Return Visit  SUBJECTIVE  History of Present Illness: ARDENA GANGL is a 58 y.o. female seen for sore area above the clitoral region of the vagina. Patient is set to go out of the country and wants to make sure it is nothing to be concerned about.    Past Medical History: Patient  has a past medical history of Allergy, Constipation, Depression, Endometriosis, Sleep apnea, and Thyroid  goiter.   Past Surgical History: She  has a past surgical history that includes laparotomy; Laparoscopic gastric band removal with laparoscopic gastric sleeve resection (12/2014); Cholecystectomy (1999); Colonoscopy; Biopsy thyroid ; Breast biopsy (Left); Breast biopsy (Right); and Left oophorectomy.   Medications: She has a current medication list which includes the following prescription(s): bupropion hcl, cholecalciferol, clobetasol , clobetasol  ointment, clobetasol  propionate e, vitamin b-12, cyclobenzaprine, estradiol, estradiol, krill oil, lactobacillus-inulin, levocetirizine, levothyroxine, lidocaine -prilocaine , meloxicam, multivitamin-iron-minerals-folic acid, progesterone, rosuvastatin, and valacyclovir , and the following Facility-Administered Medications: lidocaine -epinephrine .   Allergies: Patient is allergic to cefaclor.   Social History: Patient  reports that she has never smoked. She has never used smokeless tobacco. She reports that she does not drink alcohol and does not use drugs.     OBJECTIVE     Physical Exam: Vitals:   11/26/23 1418  BP: 117/79  Pulse: 71   Gen: No apparent distress, A&O x 3.  Detailed Urogynecologic Evaluation:  Patient has a small pimple like area above the clitoral hood. No noted pus on exam or with palpation. Small amount of surrounding irritation. Possibly an irritated hair follicle that has previously drained.    ASSESSMENT AND PLAN    Ms. Wetherell is a 58 y.o. with:  1. Vaginal irritation    Patient can use lidocaine  at the  area of irritation for her plane ride and she can continue to use her estrogen cream to promote healing in the area. No obvious need for antibiotics. Nothing to lance or drain at this time.   Patient to return as needed.   Kalani Sthilaire G Channing Yeager, NP

## 2024-01-14 ENCOUNTER — Other Ambulatory Visit: Payer: Self-pay | Admitting: Internal Medicine

## 2024-01-14 DIAGNOSIS — E042 Nontoxic multinodular goiter: Secondary | ICD-10-CM

## 2024-02-01 ENCOUNTER — Ambulatory Visit
Admission: RE | Admit: 2024-02-01 | Discharge: 2024-02-01 | Disposition: A | Discharge status: Home / Self Care | Source: Ambulatory Visit | Attending: Internal Medicine | Admitting: Internal Medicine

## 2024-02-01 DIAGNOSIS — E042 Nontoxic multinodular goiter: Secondary | ICD-10-CM

## 2024-03-10 ENCOUNTER — Encounter: Payer: Self-pay | Admitting: *Deleted
